# Patient Record
Sex: Female | Born: 1990 | Race: White | Hispanic: No | Marital: Single
Health system: Midwestern US, Academic
[De-identification: ages and names within clinical notes are randomized; demographics above are authoritative.]

---

## 2016-11-27 MED ORDER — SYRINGE-NEEDLE,SAFETY,DISP UNT 3 ML 22 GAUGE X 1 1/2" MISC SYRG
1 | 0 refills | 28.00000 days | Status: DC
Start: 2016-11-27 — End: 2017-07-12

## 2016-11-27 MED ORDER — CYANOCOBALAMIN (VITAMIN B-12) 1,000 MCG/ML IJ SOLN
1 mL | INTRAMUSCULAR | 0 refills | 29.00000 days | Status: DC
Start: 2016-11-27 — End: 2017-07-12

## 2016-12-28 ENCOUNTER — Encounter: Admit: 2016-12-28 | Discharge: 2016-12-28 | Payer: BC Managed Care – PPO

## 2017-02-08 ENCOUNTER — Encounter: Admit: 2017-02-08 | Discharge: 2017-02-08 | Payer: BC Managed Care – PPO

## 2017-05-05 ENCOUNTER — Encounter: Admit: 2017-05-05 | Discharge: 2017-05-05 | Payer: BC Managed Care – PPO

## 2017-05-05 NOTE — Telephone Encounter
Patient phoned to say she had to cancel appointment for Monday  Had labs done recently with PCP and thyroid is abnormal  Is having lab results faxed to [place]  Cant get in till December

## 2017-05-06 NOTE — Telephone Encounter
Closing encounter until labs get here - not ordered by [place]

## 2017-05-06 NOTE — Telephone Encounter
Labs received TSH 0.26, FT4 1.79  Forwarding labs to Dr David Stall for review

## 2017-05-07 NOTE — Telephone Encounter
Called Carla Jacobs relayed message Carla Jacobs V/U  Carla Jacobs states she is supposed to be taking Tirosint 125 mcg 6 days per week  Was feeling bad so bumped ut to 7 days per week  Will stay on 125 mcg and go back to 6 days per week

## 2017-05-07 NOTE — Telephone Encounter
Her thyroid dose is borderline high  We can continue the same dose - it is very close to normal - or decrease her Tirosint from 125 to 112

## 2017-05-17 ENCOUNTER — Encounter: Admit: 2017-05-17 | Discharge: 2017-05-17 | Payer: BC Managed Care – PPO

## 2017-05-18 ENCOUNTER — Encounter: Admit: 2017-05-18 | Discharge: 2017-05-18 | Payer: BC Managed Care – PPO

## 2017-05-18 MED ORDER — TIROSINT 125 MCG PO CAP
ORAL_CAPSULE | Freq: Every day | ORAL | 0 refills | 30.00000 days | Status: AC
Start: 2017-05-18 — End: 2018-11-02

## 2017-05-18 MED ORDER — LEVOTHYROXINE 112 MCG PO TAB
ORAL_TABLET | Freq: Every day | 3 refills
Start: 2017-05-18 — End: ?

## 2017-05-18 MED ORDER — LEVOTHYROXINE 125 MCG PO CAP
125 ug | ORAL_CAPSULE | Freq: Every day | ORAL | 0 refills | 30.00000 days | Status: AC
Start: 2017-05-18 — End: 2017-07-12

## 2017-07-12 ENCOUNTER — Ambulatory Visit: Admit: 2017-07-12 | Discharge: 2017-07-13 | Payer: BC Managed Care – PPO

## 2017-07-12 ENCOUNTER — Encounter: Admit: 2017-07-12 | Discharge: 2017-07-12 | Payer: BC Managed Care – PPO

## 2017-07-12 DIAGNOSIS — E538 Deficiency of other specified B group vitamins: ICD-10-CM

## 2017-07-12 DIAGNOSIS — E890 Postprocedural hypothyroidism: Principal | ICD-10-CM

## 2017-07-12 DIAGNOSIS — E039 Hypothyroidism, unspecified: Principal | ICD-10-CM

## 2017-07-12 DIAGNOSIS — M436 Torticollis: ICD-10-CM

## 2017-07-12 DIAGNOSIS — H04123 Dry eye syndrome of bilateral lacrimal glands: ICD-10-CM

## 2017-07-12 NOTE — Progress Notes
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

## 2017-07-12 NOTE — Progress Notes
Date of Service: 07/12/2017    Subjective:             Carla Jacobs is a 26 y.o. female here for follow up of hypothyroidism    History of Present Illness    Eyes have been dry and hurting, burning for a few weeks. Been using moisture drops.   No double vision  Dry nose, stuffed sinuses [place]   Some diarrhea for 2 weeks.   On Tirosint, daily daily on an empty stomach. Has skipped a couple of doses in the last week  Studying for Step 1       Review of Systems   Constitutional: Positive for unexpected weight change.   Eyes: Positive for pain and itching.   Gastrointestinal:        Reflux   Genitourinary: Positive for menstrual problem.         Objective:         ??? carisoprodol(+) (SOMA) 350 mg tablet Take 250 mg by mouth as Needed for Muscle Cramps.   ??? dextroamphetamine-amphetamine (ADDERALL) 10 mg tablet Take 10 mg by mouth twice daily   ??? ETONOGESTREL (IMPLANON SDRM) by Subdermal route.   ??? TIROSINT 125 mcg capsule TAKE ONE CAPSULE BY MOUTH EVERY DAY     Vitals:    07/12/17 1416   BP: 130/79   Pulse: 76   Weight: 81.7 kg (180 lb 3.2 oz)   Height: 170.2 cm (67.01)     Body mass index is 28.22 kg/m???.     Physical Exam   Constitutional: She is oriented to person, place, and time. She appears well-developed and well-nourished.   HENT:   Head: Normocephalic and atraumatic.   Eyes:   Mild lid lag, no stare. No exophthalmos, no injection, mild periorbital swelling   Neck: No thyromegaly present.   Pulmonary/Chest: Effort normal.   Neurological: She is alert and oriented to person, place, and time.   No tremor   Skin:   Cold clammy skin   Psychiatric: Her behavior is normal. Thought content normal.   Vitals reviewed.           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  Vit B12 deficiency  - Brief replacement of IM B12, after OTC supplements did not correct her deficiency  - Has not refilled Rx  - Repeat B12 levels  ???  RTC 12 months

## 2017-07-26 ENCOUNTER — Ambulatory Visit: Admit: 2017-07-26 | Discharge: 2017-07-26 | Payer: BC Managed Care – PPO

## 2017-07-26 DIAGNOSIS — H04123 Dry eye syndrome of bilateral lacrimal glands: ICD-10-CM

## 2017-07-26 DIAGNOSIS — E538 Deficiency of other specified B group vitamins: Principal | ICD-10-CM

## 2017-07-26 DIAGNOSIS — E890 Postprocedural hypothyroidism: ICD-10-CM

## 2017-07-26 LAB — THYROGLOBULIN & THYROGLOBULIN AB: Lab: 0.6 ng/mL — ABNORMAL LOW (ref 1.7–55.0)

## 2017-07-26 LAB — THYROID STIMULATING HORMONE-TSH: Lab: 2.1 uU/mL (ref 0.35–5.00)

## 2017-07-26 LAB — FREE T4 (FREE THYROXINE) ONLY: Lab: 1.3 ng/dL (ref 0.6–1.6)

## 2017-07-26 LAB — VITAMIN B12: Lab: 327 pg/mL (ref 180–914)

## 2017-07-28 LAB — THYROTROPIN (TSH) RECEPTOR AB

## 2017-07-29 LAB — THYROID STIM IMMUNOGLOBULIN(TSI)

## 2017-08-10 ENCOUNTER — Encounter: Admit: 2017-08-10 | Discharge: 2017-08-10 | Payer: BC Managed Care – PPO

## 2017-08-11 ENCOUNTER — Encounter: Admit: 2017-08-11 | Discharge: 2017-08-11 | Payer: BC Managed Care – PPO

## 2017-08-11 MED ORDER — TIROSINT 125 MCG PO CAP
ORAL_CAPSULE | Freq: Every day | ORAL | 3 refills | 30.00000 days | Status: AC
Start: 2017-08-11 — End: 2018-11-02

## 2018-06-03 ENCOUNTER — Encounter: Admit: 2018-06-03 | Discharge: 2018-06-03 | Payer: BC Managed Care – PPO

## 2018-06-03 MED ORDER — CYANOCOBALAMIN (VITAMIN B-12) 1,000 MCG/ML IJ SOLN
1 mL | INTRAMUSCULAR | 0 refills
Start: 2018-06-03 — End: ?

## 2018-09-03 ENCOUNTER — Encounter: Admit: 2018-09-03 | Discharge: 2018-09-03 | Payer: BC Managed Care – PPO

## 2018-09-05 ENCOUNTER — Ambulatory Visit: Admit: 2018-09-05 | Discharge: 2018-09-06 | Payer: BC Managed Care – PPO

## 2018-09-05 ENCOUNTER — Encounter: Admit: 2018-09-05 | Discharge: 2018-09-05 | Payer: BC Managed Care – PPO

## 2018-09-05 DIAGNOSIS — N898 Other specified noninflammatory disorders of vagina: ICD-10-CM

## 2018-09-05 DIAGNOSIS — R05 Cough: Secondary | ICD-10-CM

## 2018-09-05 DIAGNOSIS — E039 Hypothyroidism, unspecified: Principal | ICD-10-CM

## 2018-09-05 DIAGNOSIS — M436 Torticollis: ICD-10-CM

## 2018-09-05 LAB — DIRECT EXAM (WET PREP)

## 2018-09-05 MED ORDER — TIROSINT 125 MCG PO CAP
ORAL_CAPSULE | Freq: Every day | 2 refills
Start: 2018-09-05 — End: ?

## 2018-09-05 MED ORDER — OSELTAMIVIR 75 MG PO CAP
75 mg | ORAL_CAPSULE | Freq: Two times a day (BID) | ORAL | 0 refills | Status: AC
Start: 2018-09-05 — End: 2018-09-05

## 2018-09-05 MED ORDER — TIROSINT 125 MCG PO CAP
ORAL_CAPSULE | Freq: Every day | 0 refills
Start: 2018-09-05 — End: ?

## 2018-09-05 MED ORDER — VALACYCLOVIR 1 GRAM PO TAB
1000 mg | ORAL_TABLET | Freq: Two times a day (BID) | ORAL | 0 refills | Status: AC
Start: 2018-09-05 — End: ?

## 2018-09-05 NOTE — Telephone Encounter
Patient called and requested for Tamiflu to be canceled and requested Valtrex to be sent for vaginal lesions.

## 2018-09-06 DIAGNOSIS — Z9189 Other specified personal risk factors, not elsewhere classified: ICD-10-CM

## 2018-09-06 DIAGNOSIS — J101 Influenza due to other identified influenza virus with other respiratory manifestations: Principal | ICD-10-CM

## 2018-09-06 DIAGNOSIS — N898 Other specified noninflammatory disorders of vagina: ICD-10-CM

## 2018-09-06 LAB — CULTURE-URINE W/SENSITIVITY

## 2018-09-06 LAB — CHLAM/NG PCR SWAB: Lab: NEGATIVE

## 2018-09-06 LAB — TRICHOMONAS VAGINALIS SWAB: Lab: NEGATIVE

## 2018-09-06 LAB — HERPES SIMPLEX PCR - NON-BLOOD

## 2018-09-07 ENCOUNTER — Encounter: Admit: 2018-09-07 | Discharge: 2018-09-07 | Payer: BC Managed Care – PPO

## 2018-09-07 LAB — HERPES SIMPLEX IGG AB (HSV IGG)
Lab: NEGATIVE FL (ref 7–11)
Lab: NEGATIVE K/UL (ref 150–400)

## 2018-09-07 MED ORDER — FLUCONAZOLE 150 MG PO TAB
ORAL_TABLET | ORAL | 0 refills | 3.00000 days | Status: AC
Start: 2018-09-07 — End: 2019-01-12

## 2018-09-22 ENCOUNTER — Encounter: Admit: 2018-09-22 | Discharge: 2018-09-22 | Payer: BC Managed Care – PPO

## 2018-10-30 ENCOUNTER — Encounter: Admit: 2018-10-30 | Discharge: 2018-10-30 | Payer: BC Managed Care – PPO

## 2018-11-02 MED ORDER — TIROSINT 125 MCG PO CAP
125 ug | ORAL_CAPSULE | Freq: Every day | ORAL | 1 refills | 30.00000 days | Status: DC
Start: 2018-11-02 — End: 2019-01-12

## 2018-11-03 ENCOUNTER — Encounter: Admit: 2018-11-03 | Discharge: 2018-11-03 | Payer: BC Managed Care – PPO

## 2018-11-04 ENCOUNTER — Encounter: Admit: 2018-11-04 | Discharge: 2018-11-04 | Payer: BC Managed Care – PPO

## 2018-11-04 NOTE — Telephone Encounter
Faxed completed form to [facility]   Fax confirmation received 4/10 at 1020

## 2019-01-11 ENCOUNTER — Encounter: Admit: 2019-01-11 | Discharge: 2019-01-11

## 2019-01-11 NOTE — Telephone Encounter
Spoke to patient - confirmed tomorrow's visit is TELEHEALTH

## 2019-01-12 ENCOUNTER — Encounter: Admit: 2019-01-12 | Discharge: 2019-01-12

## 2019-01-12 DIAGNOSIS — K2970 Gastritis, unspecified, without bleeding: Secondary | ICD-10-CM

## 2019-01-12 DIAGNOSIS — E039 Hypothyroidism, unspecified: Secondary | ICD-10-CM

## 2019-01-12 DIAGNOSIS — M436 Torticollis: Secondary | ICD-10-CM

## 2019-01-12 NOTE — Progress Notes
Obtained patient's verbal consent to treat them and their agreement to [place] and NPP via this telehealth visit during the [facility] Emergency  Vitals obtained by patient at home           [facility] - drew thyroid labs and changed dosing in last few months . Thinks it was march   Called that office  They are faxing thyroid labs they have   No B12 lab results in their system  She did go ahead and give verbal that TSH 0.042 on 3/9

## 2019-01-12 NOTE — Progress Notes
Date of Service: 01/12/2019    Subjective:             Carla Jacobs is a 28 y.o. 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   Dose has been fluctuating, with doses between 100 and .     Current treatment: BRAND NAME Tirosint 112 mcg daily   ???  Fa had Graves disease, treated with RAI.   Mother has been hyperthyroid for a few years, TSH<0.01 but has not proceeded with work up as she feels fine.                Review of Systems   Constitutional: Positive for fatigue and unexpected weight change.   Eyes: Negative for visual disturbance.   Respiratory: Negative for shortness of breath.    Cardiovascular: Negative for chest pain and palpitations.   Gastrointestinal: Positive for nausea. Negative for constipation and diarrhea.        Reflux   Endocrine: Negative for cold intolerance and heat intolerance.   Genitourinary: Positive for menstrual problem.   Neurological: Negative for tremors and weakness. Psychiatric/Behavioral: Negative for decreased concentration, dysphoric mood and sleep disturbance.         Objective:         ??? carisoprodol(+) (SOMA) 350 mg tablet Take 250 mg by mouth as Needed for Muscle Cramps.   ??? dextroamphetamine-amphetamine (ADDERALL) 10 mg tablet Take 10 mg by mouth twice daily   ??? levonorgestrel (SKYLA IU) by Intrauterine route.   ??? levothyroxine (TIROSINT) 112 mcg capsule Take 112 mcg by mouth daily. TIROSINT     Vitals:    01/12/19 1353   Weight: 77.1 kg (170 lb)   Height: 168.9 cm (66.5)   PainSc: Zero     Body mass index is 27.03 kg/m???.     Physical Exam  Vitals signs reviewed.   Constitutional:       Appearance: She is well-developed.   HENT:      Head: Normocephalic and atraumatic.   Neck:      Thyroid: No thyromegaly.   Pulmonary:      Effort: Pulmonary effort is normal.   Neurological:      Mental Status: She is alert and oriented to person, place, and time.      Comments: No tremor   Psychiatric:         Behavior: Behavior normal.  Thought Content: Thought content normal.              Assessment and Plan:                 Postablative hypothyroidism  - On Tirosint replacement, previously well replaced  - Appears clinically euthyroid  - DDx includes biochemical hypo-, hyper- or euthyroidism  - Check TFTs  8 wks after last dose change   - Adjust Tirosint???dose for goal TSH in the normal range    ???  Vit B12 deficiency  - Brief replacement of IM B12, after OTC supplements did not correct her deficiency  - Has not refilled Rx  - Repeat B12 levels  ???  RTC 12 months

## 2019-01-13 ENCOUNTER — Ambulatory Visit: Admit: 2019-01-12 | Discharge: 2019-01-13

## 2019-01-13 DIAGNOSIS — E538 Deficiency of other specified B group vitamins: Secondary | ICD-10-CM

## 2019-01-13 DIAGNOSIS — E890 Postprocedural hypothyroidism: Principal | ICD-10-CM

## 2019-01-13 LAB — VITAMIN B12: Lab: 5.4 — AB (ref 200–1100)

## 2019-01-13 LAB — TSH WITH FREE T4 REFLEX: Lab: 0.4 — AB (ref 0.465–4.68)

## 2019-01-19 ENCOUNTER — Encounter: Admit: 2019-01-19 | Discharge: 2019-01-19

## 2019-01-20 ENCOUNTER — Encounter: Admit: 2019-01-20 | Discharge: 2019-01-20

## 2019-01-20 DIAGNOSIS — E538 Deficiency of other specified B group vitamins: Secondary | ICD-10-CM

## 2019-01-20 DIAGNOSIS — E890 Postprocedural hypothyroidism: Secondary | ICD-10-CM

## 2019-01-20 MED ORDER — LEVOTHYROXINE 100 MCG PO CAP
100 ug | ORAL_CAPSULE | Freq: Every day | ORAL | 3 refills | 30.00000 days | Status: DC
Start: 2019-01-20 — End: 2019-01-23

## 2019-01-20 NOTE — Telephone Encounter
See Labs entered from 01/13/2019

## 2019-01-20 NOTE — Telephone Encounter
Relayed message to patient . Patient asking if Carla Jacobs can see a reason as to why she would be having to adjust the dose again. She always has just bounced back between the 112-125 mcg dosing and never had to go beyond that. Patient also wants lab order routed to her through mychart  .Routing to  [facility]

## 2019-01-20 NOTE — Telephone Encounter
-----   Message from Annamary Rummage, [place] sent at 01/20/2019 12:55 PM CDT -----  B 12 is actually 504.  Let her know she is still a bit overreplaced so she can reduce to 100 mcg daily and then repeat TSH/free T4 in 8 weeks.  [place]

## 2019-01-21 ENCOUNTER — Encounter: Admit: 2019-01-21 | Discharge: 2019-01-21

## 2019-01-23 ENCOUNTER — Encounter: Admit: 2019-01-23 | Discharge: 2019-01-23

## 2019-01-23 MED ORDER — TIROSINT 100 MCG PO CAP
100 ug | ORAL_CAPSULE | Freq: Every day | ORAL | 3 refills | 30.00000 days | Status: DC
Start: 2019-01-23 — End: 2019-02-02

## 2019-02-02 ENCOUNTER — Encounter: Admit: 2019-02-02 | Discharge: 2019-02-02

## 2019-02-02 DIAGNOSIS — E039 Hypothyroidism, unspecified: Secondary | ICD-10-CM

## 2019-02-02 MED ORDER — TIROSINT 112 MCG PO CAP
112 ug | ORAL_CAPSULE | Freq: Every day | ORAL | 5 refills | 30.00000 days | Status: DC
Start: 2019-02-02 — End: 2019-05-24

## 2019-02-16 NOTE — Telephone Encounter
See mychart messages

## 2019-05-23 ENCOUNTER — Encounter: Admit: 2019-05-23 | Discharge: 2019-05-23 | Payer: BC Managed Care – PPO

## 2019-05-24 ENCOUNTER — Encounter: Admit: 2019-05-24 | Discharge: 2019-05-24 | Payer: BC Managed Care – PPO

## 2019-05-24 DIAGNOSIS — E039 Hypothyroidism, unspecified: Secondary | ICD-10-CM

## 2019-05-24 MED ORDER — TIROSINT 112 MCG PO CAP
112 ug | ORAL_CAPSULE | Freq: Every day | ORAL | 5 refills | 30.00000 days | Status: DC
Start: 2019-05-24 — End: 2020-02-26

## 2019-05-26 ENCOUNTER — Encounter: Admit: 2019-05-26 | Discharge: 2019-05-26 | Payer: BC Managed Care – PPO

## 2019-08-17 ENCOUNTER — Encounter: Admit: 2019-08-17 | Discharge: 2019-08-17 | Payer: BC Managed Care – PPO

## 2019-10-10 ENCOUNTER — Encounter: Admit: 2019-10-10 | Discharge: 2019-10-10 | Payer: BC Managed Care – PPO

## 2019-10-10 DIAGNOSIS — N72 Inflammatory disease of cervix uteri: Secondary | ICD-10-CM

## 2019-10-10 DIAGNOSIS — N760 Acute vaginitis: Secondary | ICD-10-CM

## 2019-10-10 DIAGNOSIS — K2970 Gastritis, unspecified, without bleeding: Secondary | ICD-10-CM

## 2019-10-10 DIAGNOSIS — M436 Torticollis: Secondary | ICD-10-CM

## 2019-10-10 DIAGNOSIS — E039 Hypothyroidism, unspecified: Secondary | ICD-10-CM

## 2019-10-10 MED ORDER — METRONIDAZOLE 500 MG PO TAB
500 mg | ORAL_TABLET | Freq: Two times a day (BID) | ORAL | 0 refills | Status: AC
Start: 2019-10-10 — End: ?

## 2019-10-10 MED ORDER — AZITHROMYCIN 1 GRAM PO PACK
1 | Freq: Once | ORAL | 0 refills | Status: AC
Start: 2019-10-10 — End: ?

## 2019-10-10 MED ORDER — FLUCONAZOLE 150 MG PO TAB
150 mg | ORAL_TABLET | Freq: Once | ORAL | 0 refills | 3.00000 days | Status: AC
Start: 2019-10-10 — End: ?

## 2019-10-10 MED ORDER — CEFTRIAXONE/LIDOCAINE IM 250MG VIAL MIXTURE
250 mg | Freq: Once | INTRAMUSCULAR | 0 refills | Status: CP
Start: 2019-10-10 — End: ?

## 2019-10-11 ENCOUNTER — Ambulatory Visit: Admit: 2019-10-11 | Discharge: 2019-10-10 | Payer: BC Managed Care – PPO

## 2019-11-10 ENCOUNTER — Encounter: Admit: 2019-11-10 | Discharge: 2019-11-10 | Payer: BC Managed Care – PPO

## 2020-01-26 ENCOUNTER — Encounter: Admit: 2020-01-26 | Discharge: 2020-01-26 | Payer: BC Managed Care – PPO

## 2020-01-26 ENCOUNTER — Ambulatory Visit: Admit: 2020-01-26 | Discharge: 2020-01-27 | Payer: BC Managed Care – PPO

## 2020-01-26 DIAGNOSIS — E0500 Thyrotoxicosis with diffuse goiter without thyrotoxic crisis or storm: Secondary | ICD-10-CM

## 2020-01-26 DIAGNOSIS — E538 Deficiency of other specified B group vitamins: Secondary | ICD-10-CM

## 2020-01-26 DIAGNOSIS — K2970 Gastritis, unspecified, without bleeding: Secondary | ICD-10-CM

## 2020-01-26 DIAGNOSIS — M436 Torticollis: Secondary | ICD-10-CM

## 2020-01-26 DIAGNOSIS — E039 Hypothyroidism, unspecified: Secondary | ICD-10-CM

## 2020-01-26 NOTE — Patient Instructions
It was great to see you today.        Our plan:  - get labs drawn for thryoid, CBC, B12, Folate  - Important!! Please hold any supplements containing biotin for 3-5 days prior to lab draw. They can interfere with the test results.  Thyroid labs can be done at any time of day; you do not need to fast.     - go ahead and start OTC B12 replacement; 1000 to 2000 mcg daily     Please make an appointment to come back to clinic in 6 months. I encourage you to do this as soon as possible.     How to get an Appointment:  ? Call [phone], Option 1 for scheduling.  Our office hours are 7am - 4:30pm Monday through Friday.    My nurse is April. You can reach her at [phone]    You can also reach me by sending a secure email message through MyChart email that links directly to your chart.    How to get your test results:  ? We will contact you by MyChart or phone with any test results when they are available.   ? If you are expecting results and have not heard from [place] within 2 weeks of your testing, please send a MyChart message or call the office.    How to get a Medication Refill:  ? Please use the MyChart Refill request or contact your pharmacy directly to request medication refills.  ? Please allow at least 72 hours for refill requests      Your time is important and if you had to wait at all today, I sincerely apologize. My goal is to run exactly on time; however, on occasion, I get behind in clinic due to unexpected patient issues.  Your satisfaction with the care you received today is of utmost importance.  Please contact me directly via MyChart if there are any issues.    Take care,     Glyn Ade, MD

## 2020-01-26 NOTE — Progress Notes
Obtained patient's verbal consent to treat them and their agreement to [facility] financial policy and NPP via this telehealth visit during the [facility] Emergency      Date of Service: 01/26/2020    Subjective:             Carla Jacobs is a 29 y.o. female here for follow up of hypothyroidism  Patient is new to me today.  Chart reviewed / old records reviewed / labs reviewed / imaging reviewed.  I have summarized pertinent information throughout this encounter.       History of Present Illness  Briefly, she has a pmhx of Graves disease status post ablative hypothyroidism,  B12 deficiency, torticollis, and gastritis.       Graves disease s/p RAI ablation  Post ablative hypothyroidism  - dx age 31, after she developed anxiety, palpitations, tremors, weight loss and insomnia  - PMHX remarkable for Hx of Graves in father (treated with RAI) and hx of hypothyroidism in mother.   - initially on high doses of MMI, then PTU, then RAI ablation  - repeat Tg <1 in 2018, demonstrating no significant residual thyroid tissue.   - maintained on LT4, with some alternating doses and brands    Last seen by APP June 2020.    - TSH over suppressed in June 2020    Since her last visit:  She is doing well. 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. [employer].   She remains on Tirosint . She has a little trouble sleeping but no tremors, palpitations, changes in hair/skin/nails. She his have some more frequent stools, but also started drinking more water. Her weight is stable. She has lost a little intentionally, nothing more than 5 lbs. She hasn't been trying to lose weight but has been trying to maintain.   She had her TSH checked in April - by Gyn.  There was a TSH of 15 but FT4 normal 0.9, T3 only slightly low.   During that time, elevated Creat, borderline K+, and she didn't feel well during that time as well.      B12 deficiency:  - had been getting B12 shots - none recently.   - intrinsic Abs checked one time.   - Vit B 12 was very low, 5.4, in June 2020  - she has had some stomach pain, gastritis.  - she has significant glossitis; fissures in her tongue.          Medical History:   Diagnosis Date   ? Gastritis    ? Hypothyroid    ? Torticollis      Surgical History:   Procedure Laterality Date   ? CYST REMOVAL      Omental cyst   ? WISDOM TEETH EXTRACTION       Family History   Problem Relation Age of Onset   ? Thyroid Disease Father         Fa with Graves s/p ablation     Social History     Socioeconomic History   ? Marital status: Single     Spouse name: Not on file   ? Number of children: Not on file   ? Years of education: Not on file   ? Highest education level: Not on file   Occupational History   ? Not on file   Tobacco Use   ? Smoking status: Never Smoker   ?  Smokeless tobacco: Never Used   Substance and Sexual Activity   ? Alcohol use: Yes     Comment: daily    ? Drug use: Not Currently   ? Sexual activity: Not on file   Other Topics Concern   ? Not on file   Social History Narrative   ? Not on file                   Review of Systems   Constitutional: Positive for fatigue.   HENT:        ++Glossitis   Eyes:        Dry eyes   Respiratory: Negative.    Cardiovascular: Negative.    Gastrointestinal:        Gastritis   Endocrine: Negative for cold intolerance and heat intolerance.   Genitourinary:        IUD   Musculoskeletal: Negative.    Skin: Negative.    Neurological: Negative for tremors.   Psychiatric/Behavioral: Positive for decreased concentration and sleep disturbance.        ADD   All other systems reviewed and are negative.        Objective:         ? carisoprodol(+) (SOMA) 350 mg tablet Take 250 mg by mouth as Needed for Muscle Cramps.   ? dextroamphetamine-amphetamine (ADDERALL) 10 mg tablet Take 20 mg by mouth three times daily     ? levonorgestrel (SKYLA IU) by Intrauterine route.   ? TIROSINT 112 mcg capsule Take one capsule by mouth daily. There were no vitals filed for this visit.  There is no height or weight on file to calculate BMI.     Physical Exam  Vitals and nursing note reviewed.   Constitutional:       General: She is not in acute distress.     Appearance: Normal appearance. She is well-developed. She is not ill-appearing.      Comments: Tele visit   HENT:      Head: Normocephalic and atraumatic.      Nose: Nose normal.   Eyes:      Conjunctiva/sclera: Conjunctivae normal.   Neck:      Thyroid: No thyromegaly.   Pulmonary:      Effort: Pulmonary effort is normal. No respiratory distress.   Neurological:      Mental Status: She is alert and oriented to person, place, and time.      Comments: No tremor   Psychiatric:         Mood and Affect: Mood normal.         Behavior: Behavior normal.         Thought Content: Thought content normal.         Judgment: Judgment normal.       Results for Carla Jacobs (MRN [id number]) as of 01/26/2020 08:12   Ref. Range 07/16/2016 11:20 11/23/2016 13:56 07/26/2017 15:41 01/13/2019 00:00   Prolactin Latest Ref Range: 3.3 - 26.7 NG/ML  6.6     T4-Free Latest Ref Range: 0.6 - 1.6 NG/DL 1.3 1.3 1.3    TSH Latest Ref Range: 0.465 - 4.68  0.549 0.220 (L) 2.120 0.406 (A)   Thyroglobulin Ab Latest Ref Range: <4.11 IU/ML  <3.00 <3.00    Thyroglobulin Latest Ref Range: 1.7 - 55.0 NG/ML  0.8 (L) 0.6 (L)    TSI Unknown   <1.0.Marland KitchenMarland Kitchen    TSH Receptor AB Unknown   <1.00...    Metanephrine,Plasma Unknown  0.22  Normetaphrine, Plasma Unknown  0.40            Assessment and Plan:               Graves disease s/p RAI ablation  Postablative hypothyroidism  - On Tirosint replacement, daily  - TSH elevation in April 2021 - due for repeat as she was ill at that time the dose was not changed.  - Appears clinically euthyroid  - she wonders about T3 supplementation  Plan:  - check TSH, FT4, Total T3  - adjust thyroid replacement as indicated  - alerted pt to inform me of any positive pregnancy test as LT4 dosing will need to be increased immediately. Discussed adverse outcomes of baby's health if inadequate thyroid dosing.    ?  Vit B12 deficiency  - Brief replacement of IM B12, after OTC supplements did not correct her deficiency  - now with gastritis, glossitis, some neuro symptoms of decreased concentration  Plan:  - Repeat B12 levels, also get CBC and folate   - go ahead and start OTC B12 replacement; 1000 to 2000 mcg daily   - discussed importance of taking B12   ?  RTC 6,mo    On the day of the tele visit, I spent a total of 40 minutes in this encounter, with greater than 50% spent in counseling and coordination of care.   I reviewed and summarized records from [facility], including notes, labs, imaging, and pathology reports.   I also reviewed any outside records that were received and Care Everywhere records (if applicable).

## 2020-01-27 DIAGNOSIS — E890 Postprocedural hypothyroidism: Secondary | ICD-10-CM

## 2020-02-07 ENCOUNTER — Encounter: Admit: 2020-02-07 | Discharge: 2020-02-07 | Payer: BC Managed Care – PPO

## 2020-02-14 ENCOUNTER — Encounter: Admit: 2020-02-14 | Discharge: 2020-02-14 | Payer: BC Managed Care – PPO

## 2020-02-14 DIAGNOSIS — E890 Postprocedural hypothyroidism: Secondary | ICD-10-CM

## 2020-02-14 DIAGNOSIS — E538 Deficiency of other specified B group vitamins: Secondary | ICD-10-CM

## 2020-02-19 ENCOUNTER — Encounter: Admit: 2020-02-19 | Discharge: 2020-02-19 | Payer: BC Managed Care – PPO

## 2020-02-19 DIAGNOSIS — E538 Deficiency of other specified B group vitamins: Secondary | ICD-10-CM

## 2020-02-19 DIAGNOSIS — E034 Atrophy of thyroid (acquired): Secondary | ICD-10-CM

## 2020-02-26 MED ORDER — TIROSINT 112 MCG PO CAP
ORAL_CAPSULE | ORAL | 3 refills | 30.00000 days | Status: AC
Start: 2020-02-26 — End: ?

## 2020-03-25 ENCOUNTER — Encounter: Admit: 2020-03-25 | Discharge: 2020-03-25 | Payer: BC Managed Care – PPO

## 2020-03-25 ENCOUNTER — Ambulatory Visit: Admit: 2020-03-25 | Discharge: 2020-03-25 | Payer: BC Managed Care – PPO

## 2020-03-25 DIAGNOSIS — E034 Atrophy of thyroid (acquired): Secondary | ICD-10-CM

## 2020-03-25 DIAGNOSIS — E039 Hypothyroidism, unspecified: Secondary | ICD-10-CM

## 2020-03-25 LAB — FREE T4 (FREE THYROXINE) ONLY: Lab: 1.2 ng/dL (ref 0.6–1.6)

## 2020-03-25 LAB — THYROID STIMULATING HORMONE-TSH: Lab: 1.6 uU/mL (ref 0.35–5.00)

## 2020-03-25 LAB — TSH WITH FREE T4 REFLEX: Lab: 1.6 uU/mL (ref 0.35–5.00)

## 2020-08-09 ENCOUNTER — Encounter: Admit: 2020-08-09 | Discharge: 2020-08-09 | Payer: BC Managed Care – PPO

## 2021-03-17 IMAGING — DX Ankle & Foot
3 series · 3 of 3 positions shown · non-contrast
Comparison: None

PROCEDURE: Three-view right ankle

Procedure(s): XR ankle RT min 3V

EXAM: 3 VIEW RIGHT ANKLE
INDICATION: Pain, ankle injury, pain

[AP (1 of 2)]
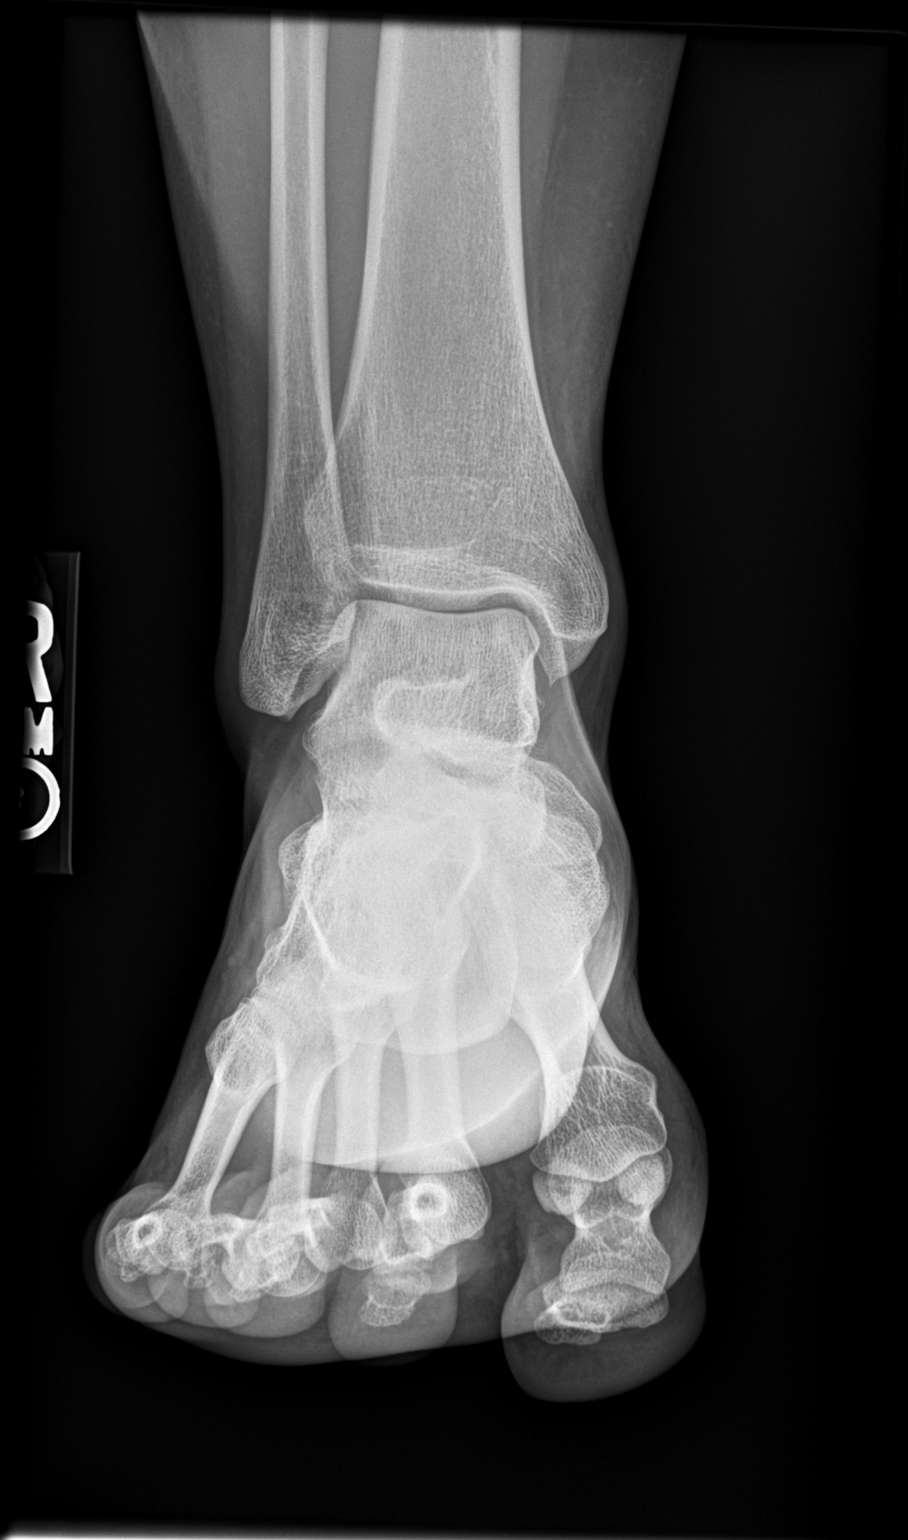

[AP (2 of 2)]
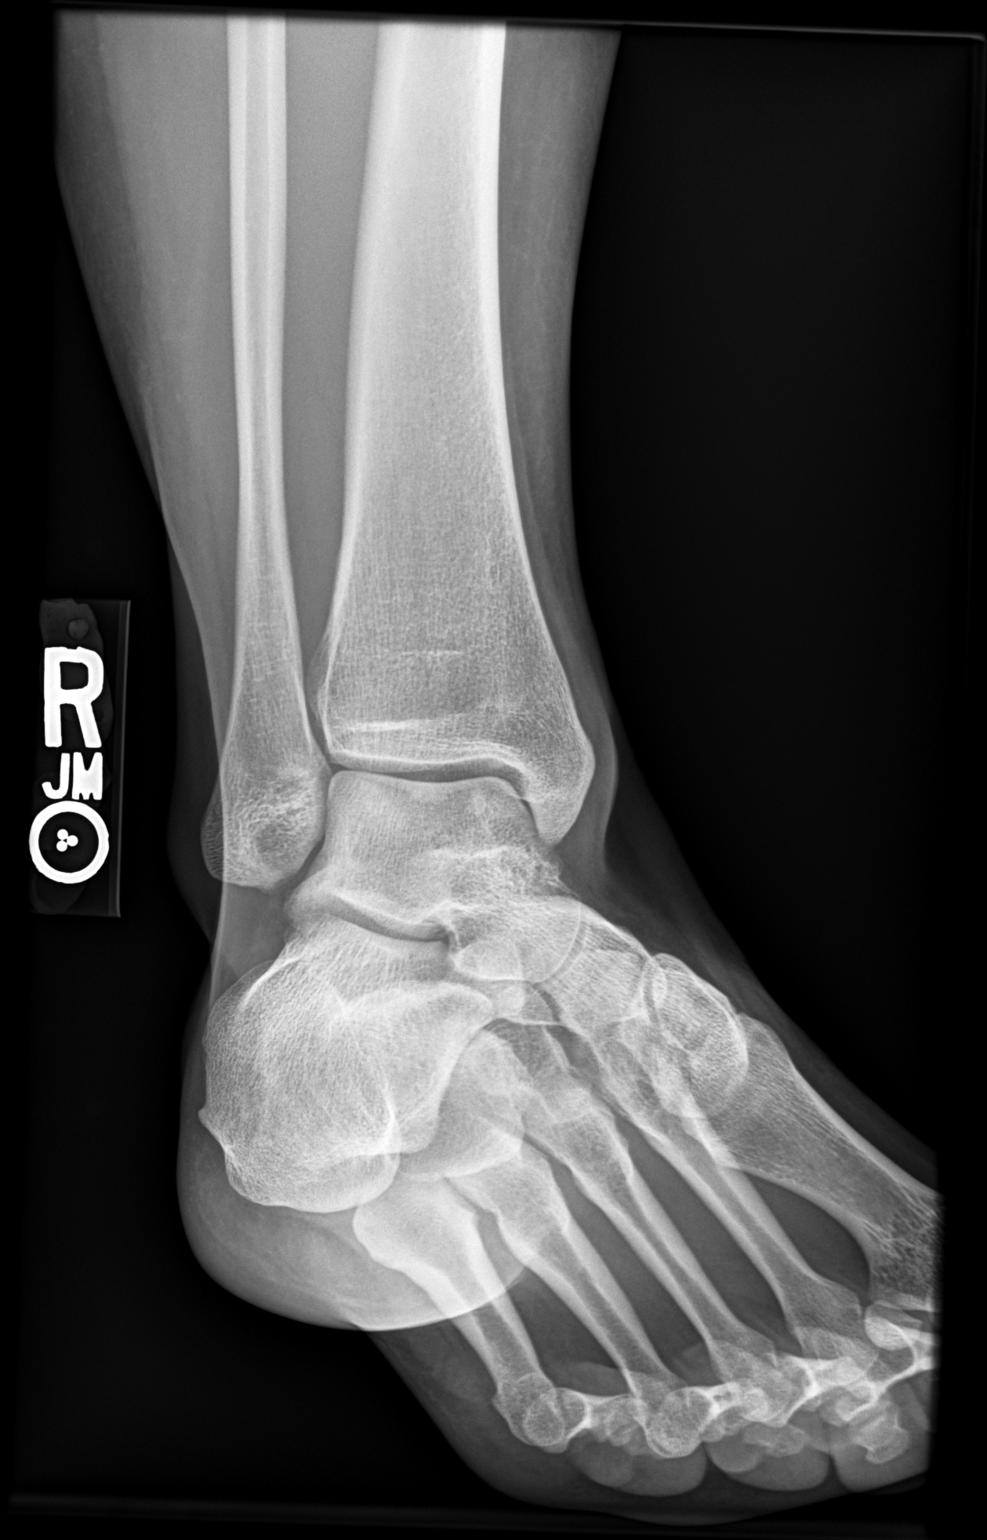

[left lateral]
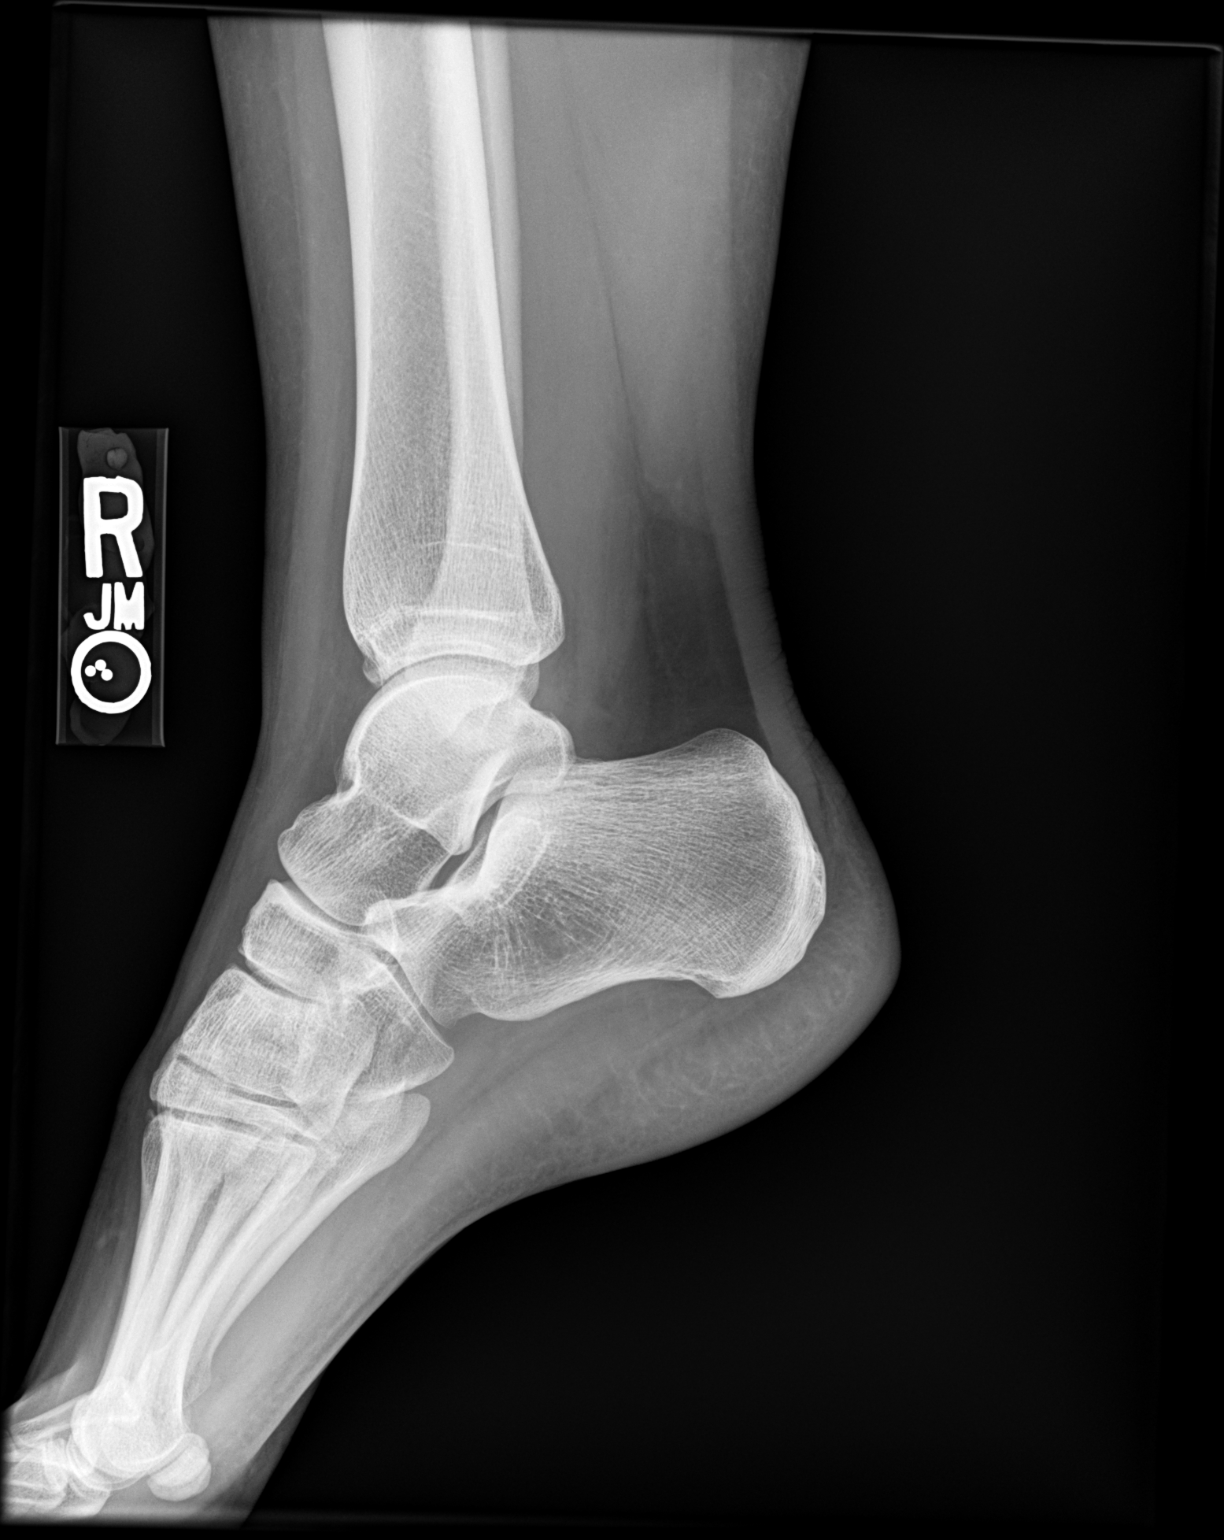

[3 of 3 positions shown; findings below may reference images not displayed]

FINDINGS: No fracture or dislocation. No foreign bodies
IMPRESSION: Normal 3 view right ankle

## 2021-03-17 IMAGING — DX Ankle & Foot
3 series · 3 of 3 positions shown · non-contrast
Comparison: None

PROCEDURE: AP, oblique and lateral views of the right foot

Procedure(s): XR foot RT min 3V

EXAM: 3 VIEW RIGHT FOOT
INDICATION: Pain, foot injury, pain

[AP (1 of 2)]
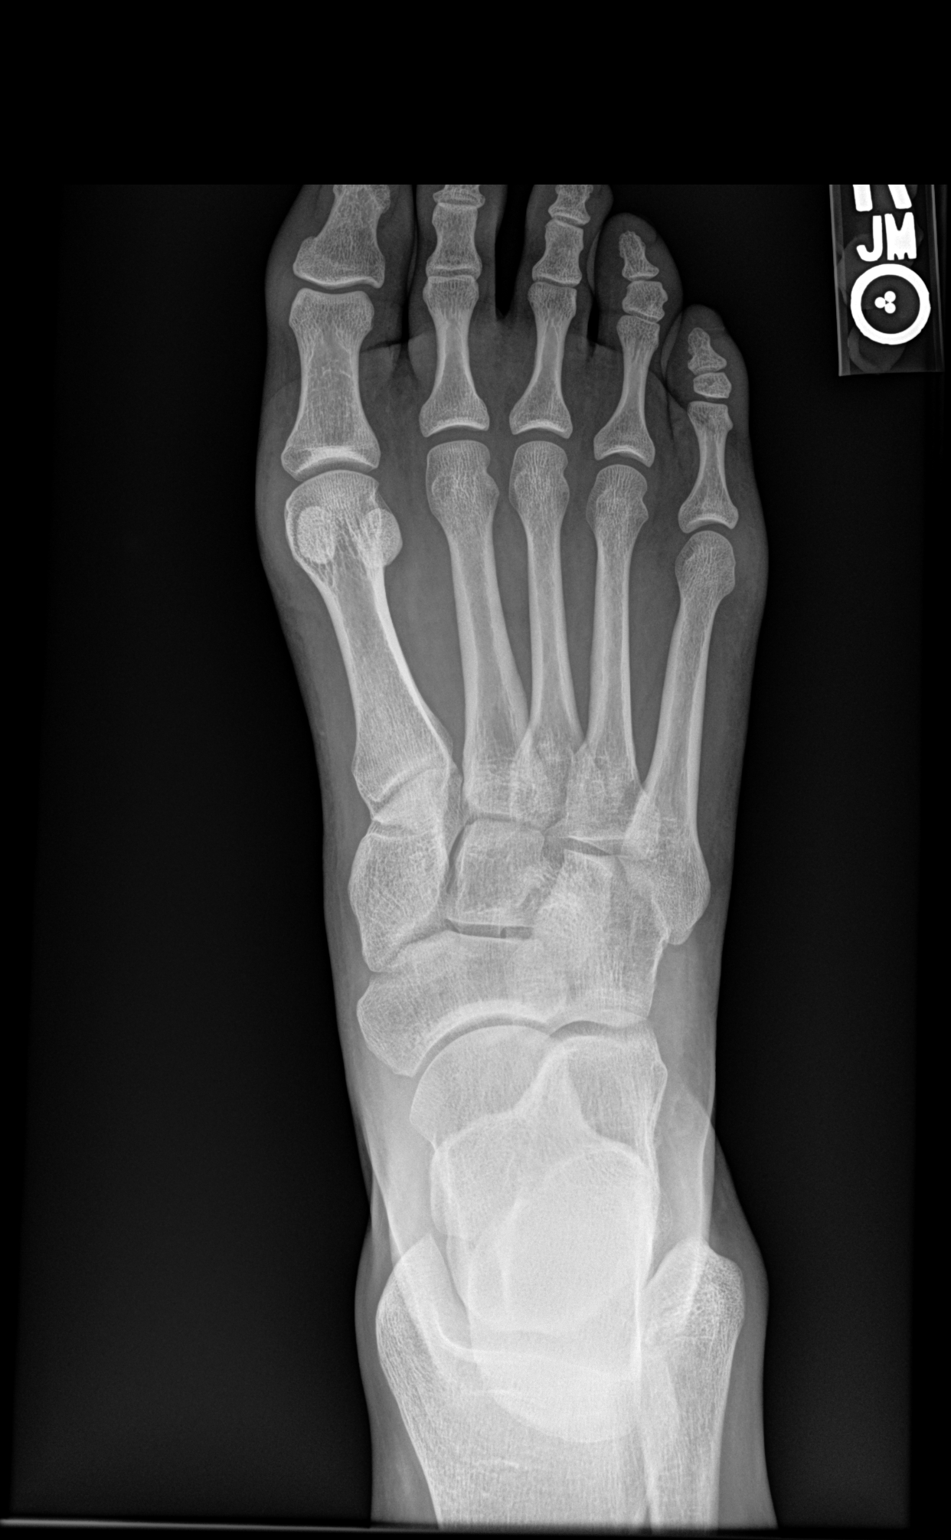

[AP (2 of 2)]
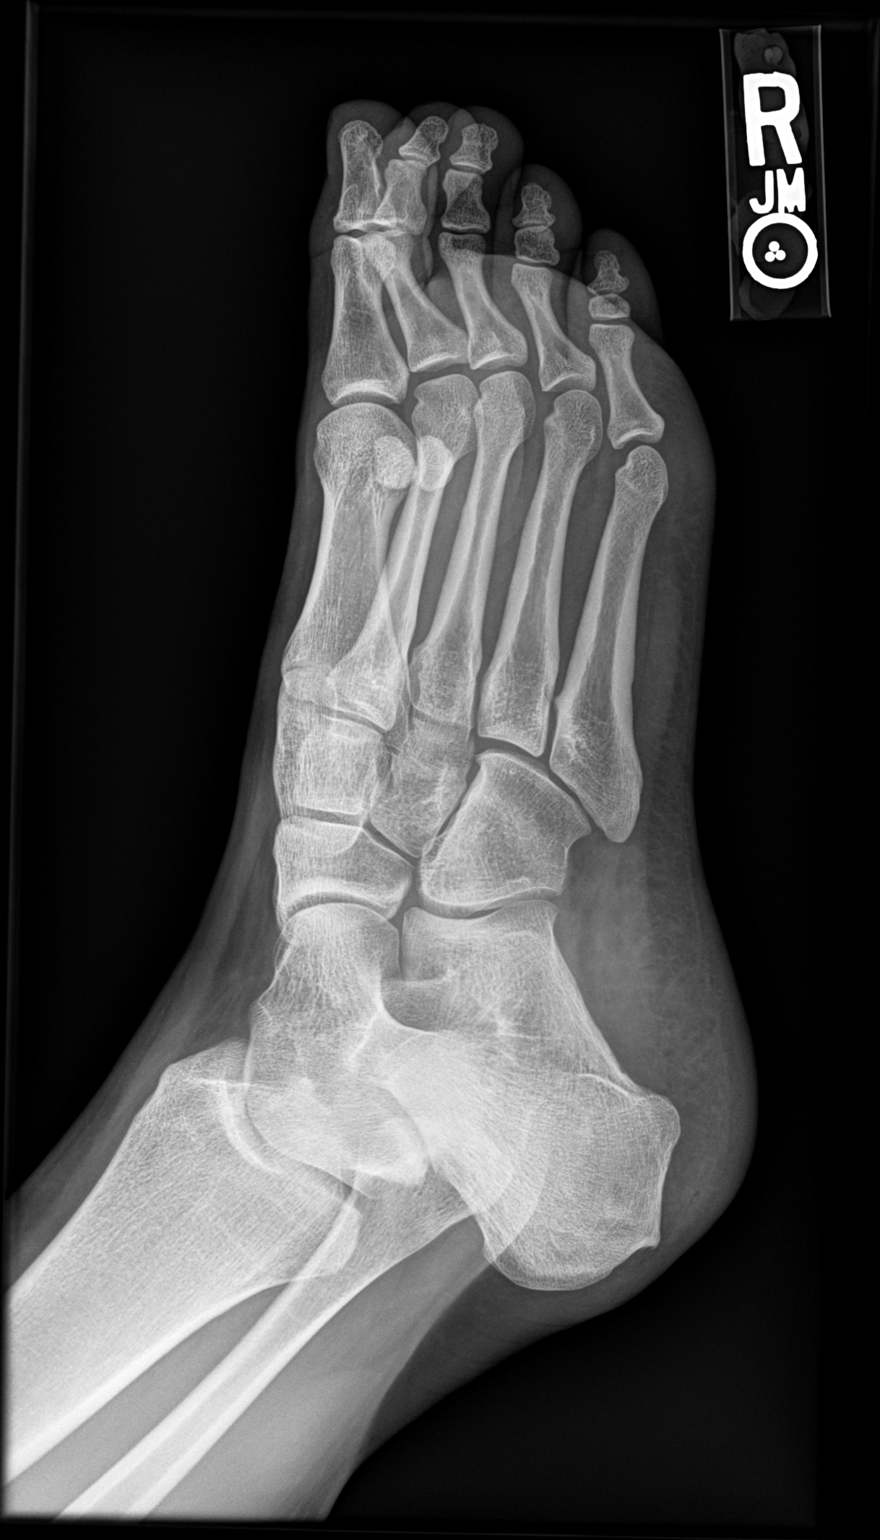

[left lateral]
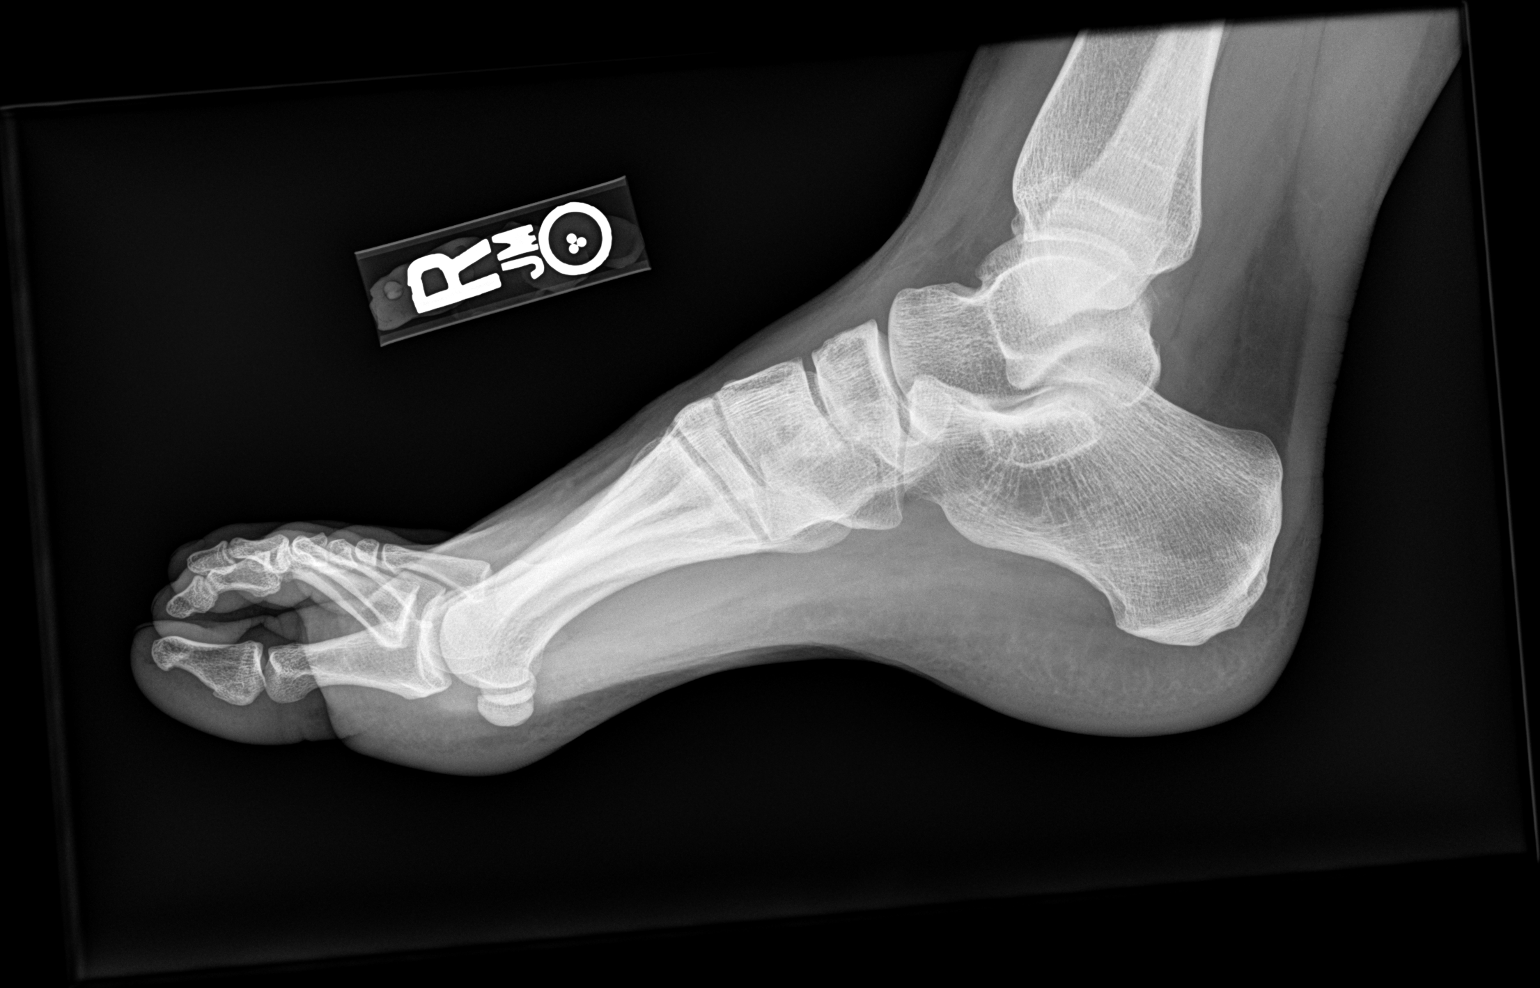

[3 of 3 positions shown; findings below may reference images not displayed]

FINDINGS: No fracture or dislocation. No foreign bodies.
IMPRESSION: Normal 3 view right foot

## 2021-05-09 IMAGING — DX Chest
2 series · 2 of 2 positions shown · non-contrast
Comparison: None

Procedure(s): XR chest 2V

CHEST, 2 VIEWS
INDICATION FOR EXAM:
Cough

[PA]
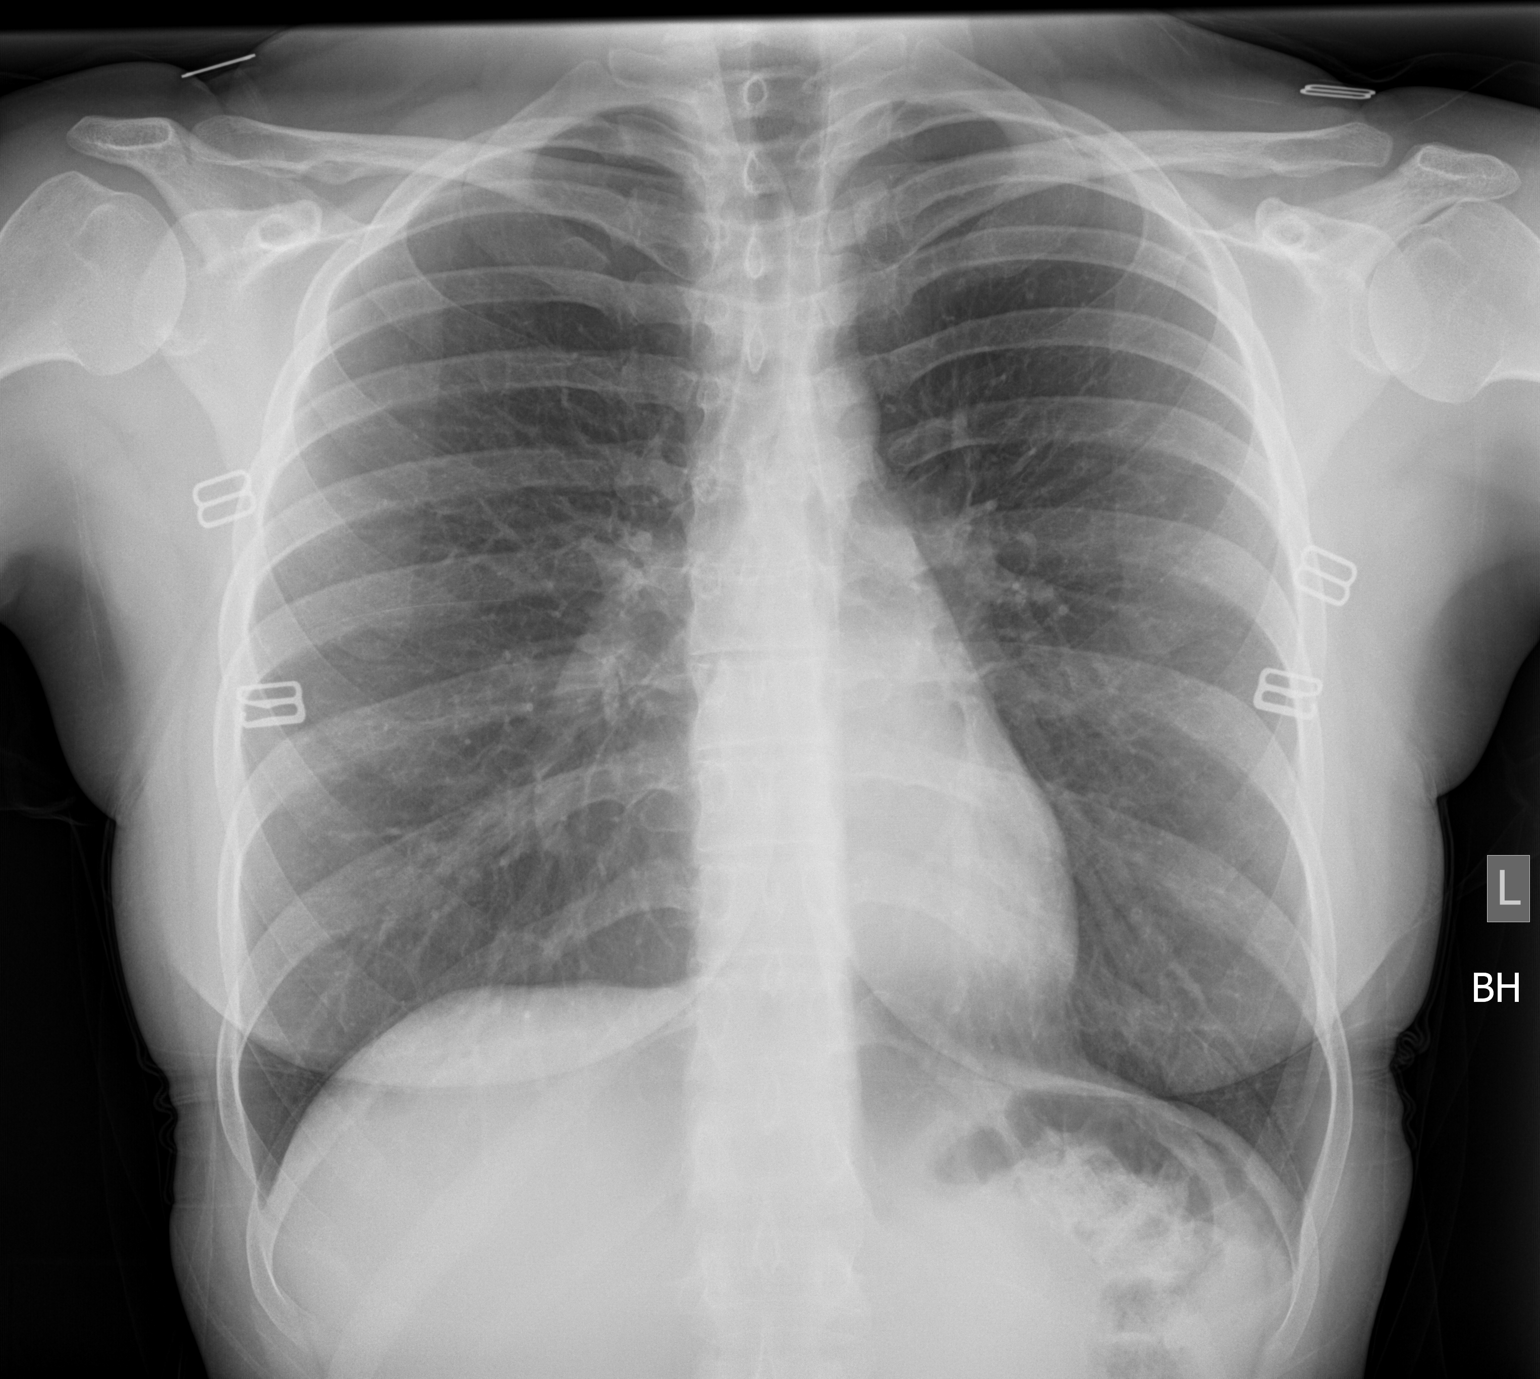

[left lateral]
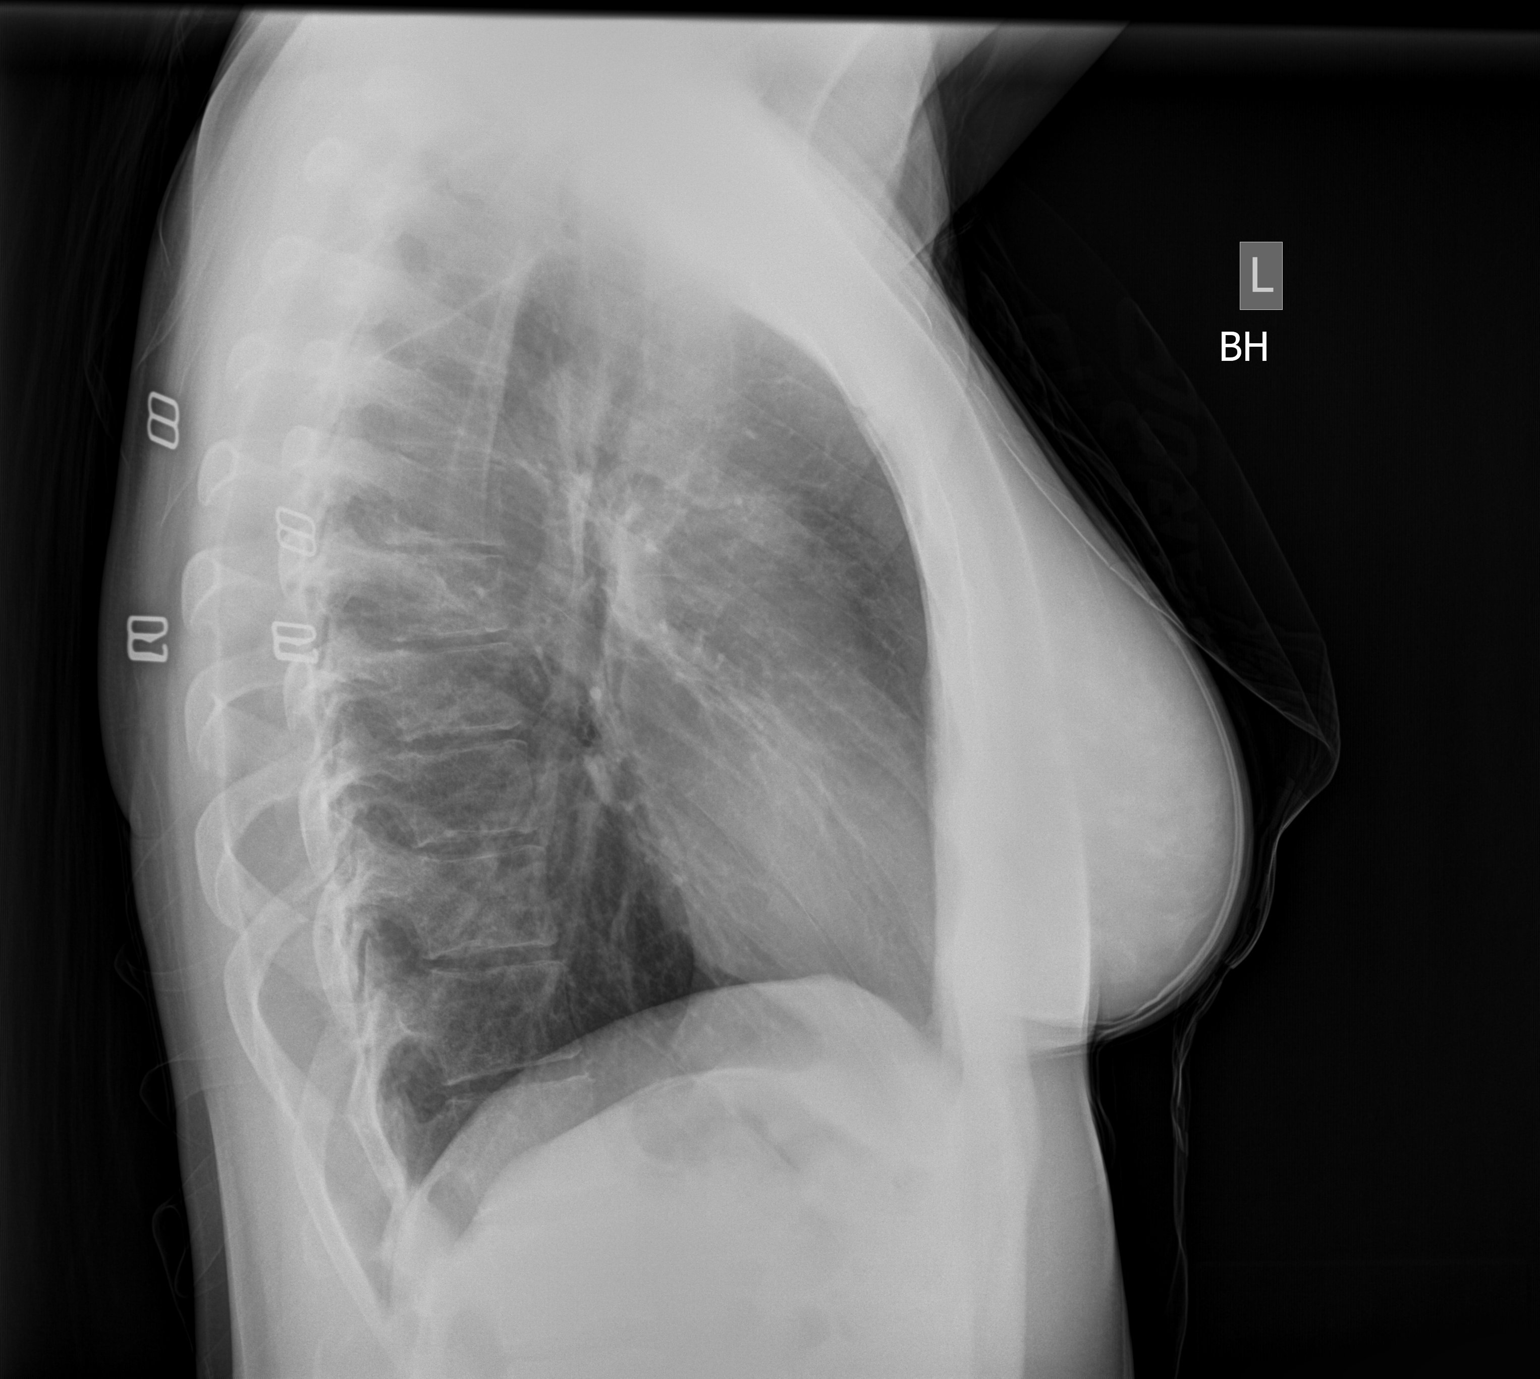

[2 of 2 positions shown; findings below may reference images not displayed]

FINDINGS: The bony thorax is grossly intact. The heart and pulmonary
vascularity are within normal limits.  No focal pulmonary
infiltrate is present.  No pleural effusion is seen.
IMPRESSION: 1.  No acute cardiopulmonary abnormality is identified

## 2021-06-06 IMAGING — MR FOOTRTWO
5 of 7 series · 24 of 40 positions shown · non-contrast
Comparison: none

Procedure(s): MR foot RT wo con

MRI OF THE RIGHT FOOT WITHOUT CONTRAST
HISTORY: Right foot pain

[Series 2: rt 3-(person_name) · axial · 4.0mm · 1.09mm/px · z∈[-36,+139]mm · 6 of 27 slices shown]
[im 1/27]
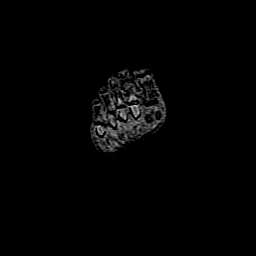
[im 6/27]
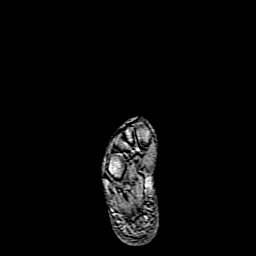
[im 11/27]
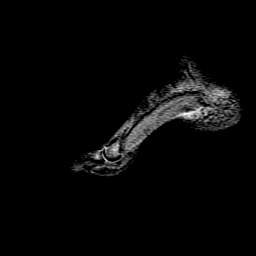
[im 16/27]
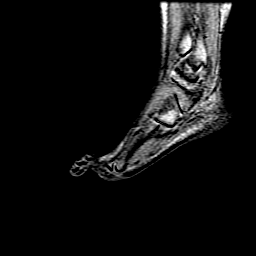
[im 21/27]
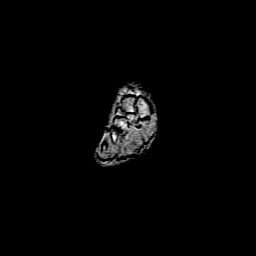
[im 27/27]
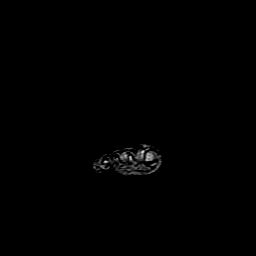

[Series 3: T1 · sagittal · 3.0mm · 0.35mm/px · 5 of 26 slices shown (1 of 3)]
[im 1/26]
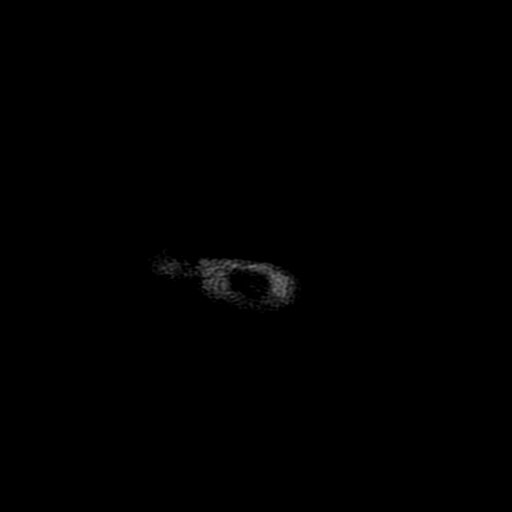
[im 7/26]
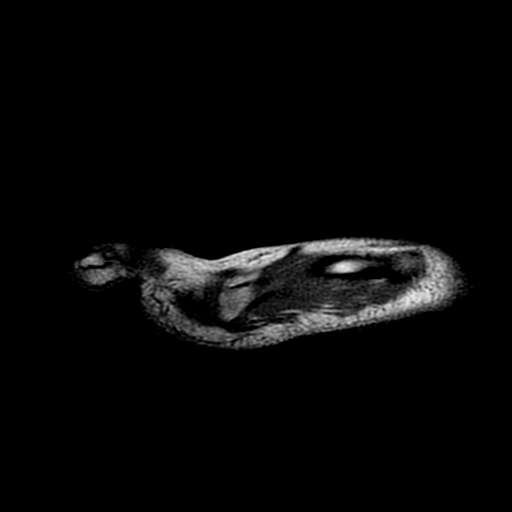
[im 13/26]
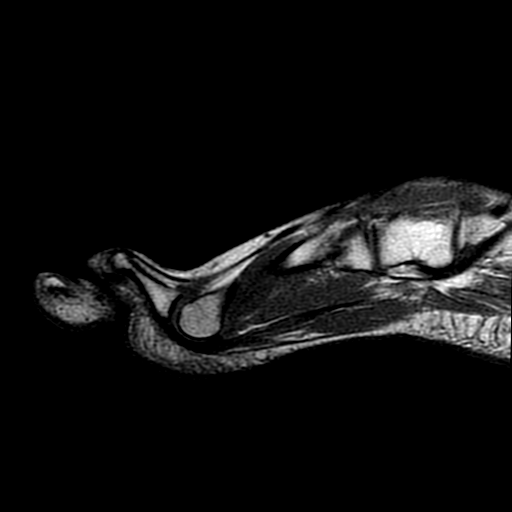
[im 19/26]
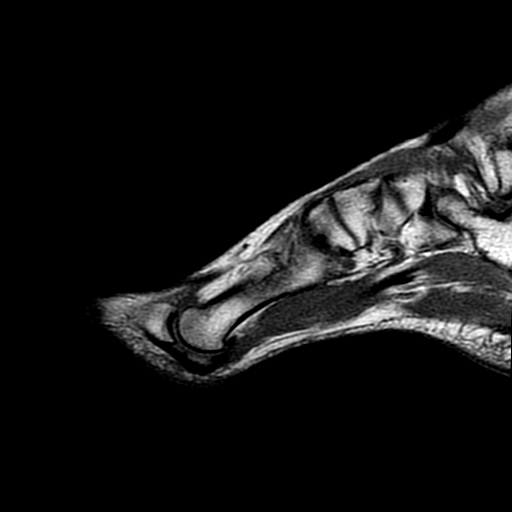
[im 26/26]
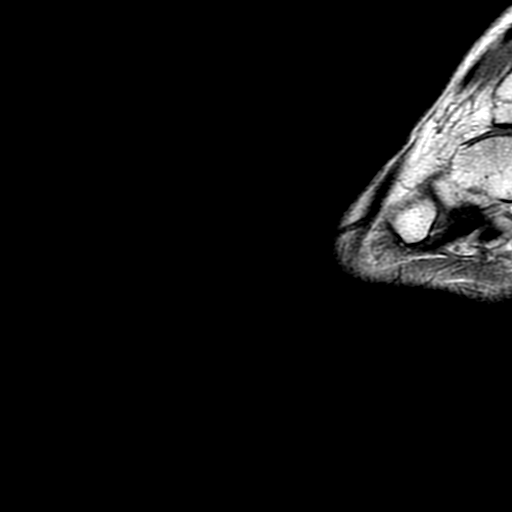

[Series 4: T2 · sagittal · 3.0mm · 0.35mm/px · 1 of 26 slices shown]
[im 1/26]
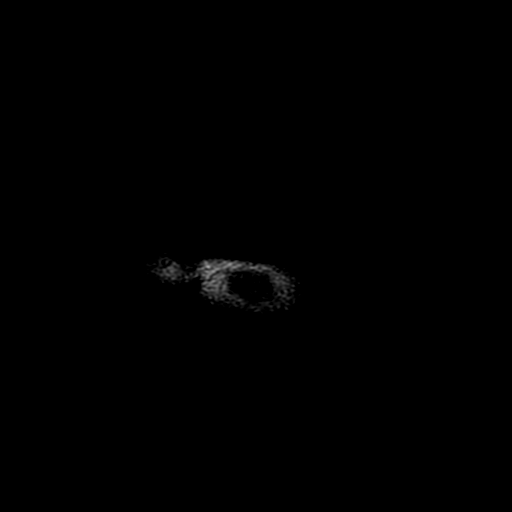

[Series 5: T1 · oblique · 4.0mm · 0.25mm/px · 7 of 32 slices shown (2 of 3)]
[im 1/32]
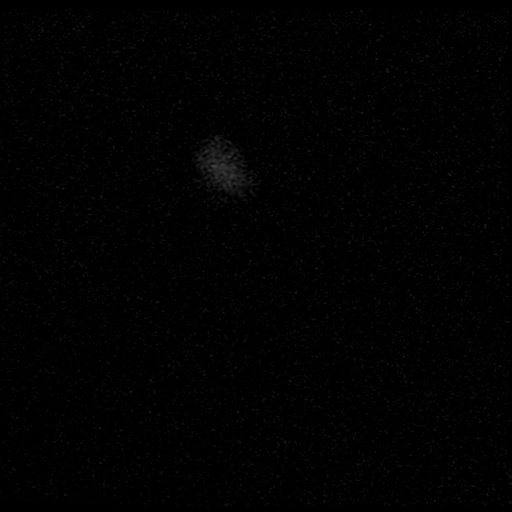
[im 6/32]
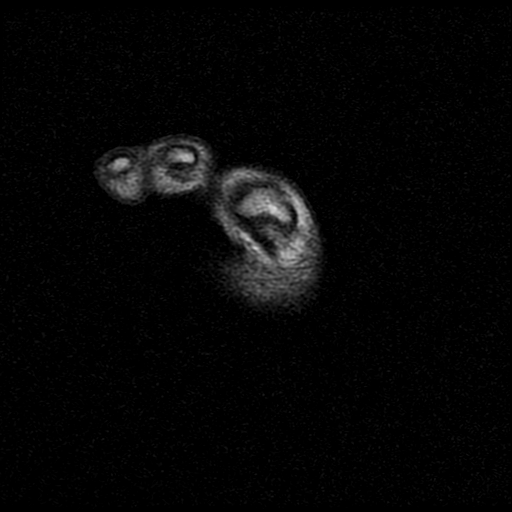
[im 11/32]
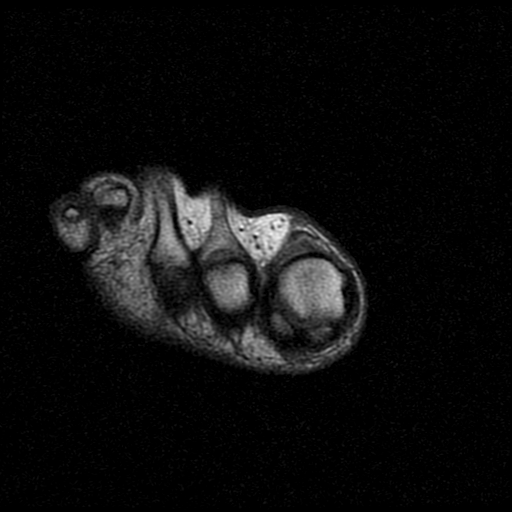
[im 16/32]
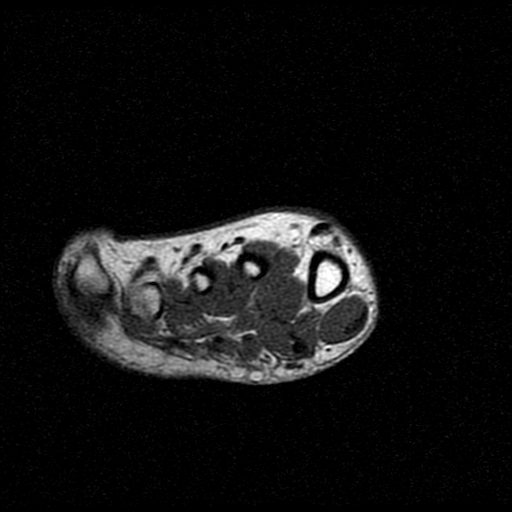
[im 21/32]
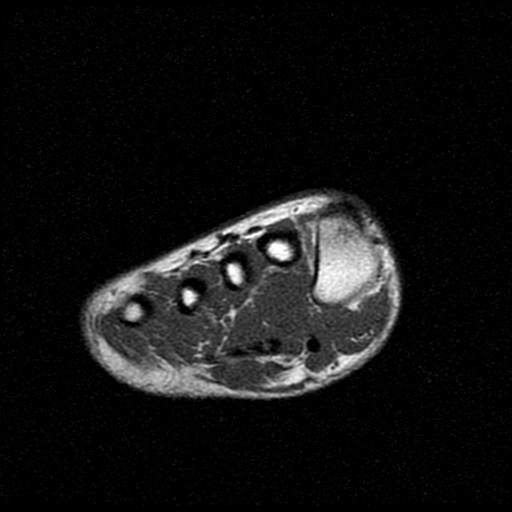
[im 26/32]
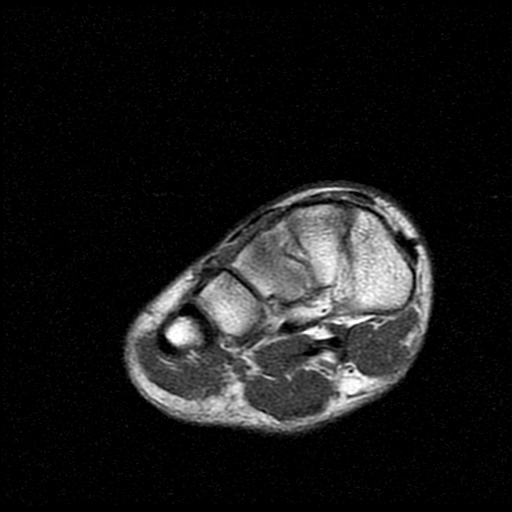
[im 32/32]
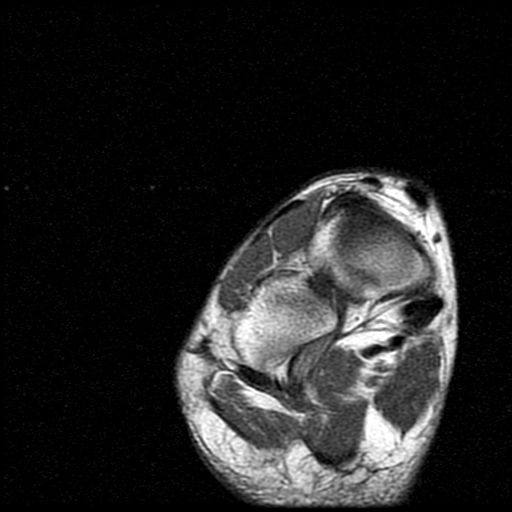

[Series 7: T1 · oblique · 3.0mm · 0.31mm/px · 5 of 24 slices shown (3 of 3)]
[im 1/24]
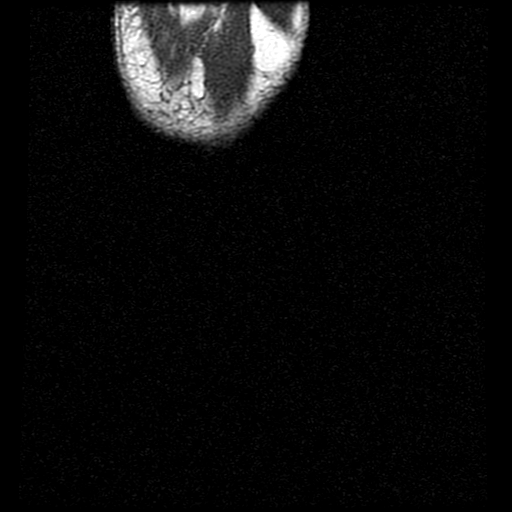
[im 6/24]
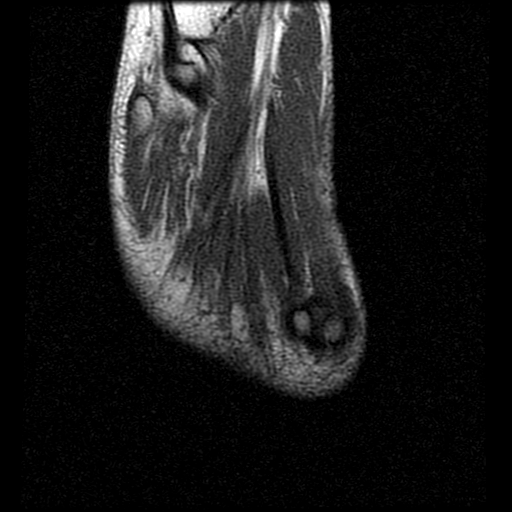
[im 12/24]
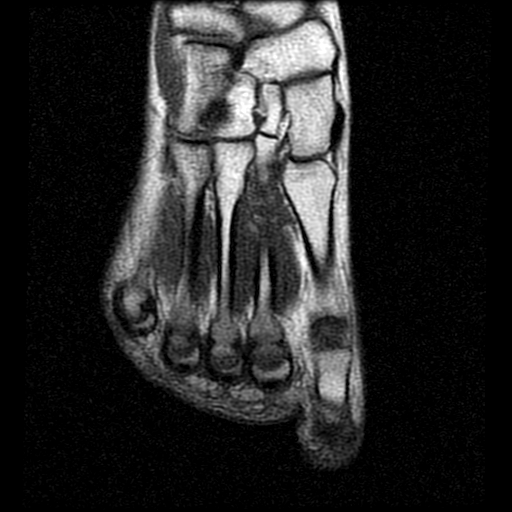
[im 18/24]
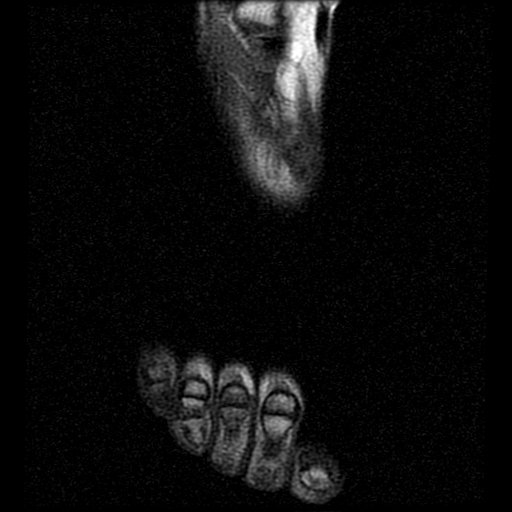
[im 24/24]
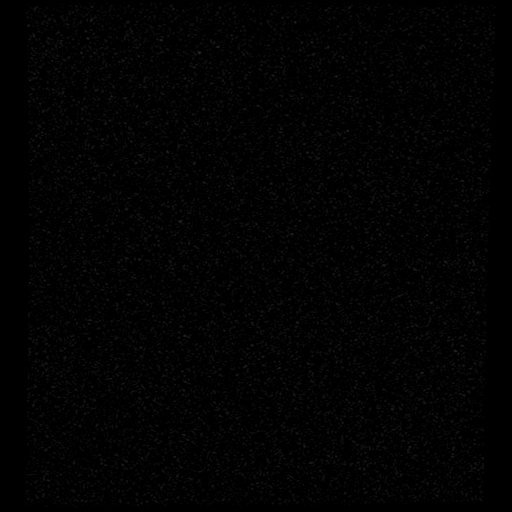

[24 of 40 positions shown; findings below may reference images not displayed]

FINDINGS: Standard imaging protocol was done for evaluation of the right forefoot. There
is normal tarsometatarsal alignment and the Lisfranc ligament is intact. There
are no findings of acute bone edema or bone injury. The collateral ligaments
appear intact. No acute reactive edema or inflammation is seen in the
surrounding soft tissues. The flexor and extensor tendons appear normal. No
abnormality is seen within the sesamoids.
IMPRESSION: 1. Normal alignment to the foot. There are no findings of acute bone edema or
acute bone injury.

2. The surrounding musculature appears normal. No soft tissue edema or
inflammation is evident.

## 2021-06-06 IMAGING — MR ANKRTWO
4 of 6 series · 19 of 40 positions shown · non-contrast
Comparison: none

Procedure(s): MR ankle RT wo con

MRI RIGHT ANKLE:
HISTORY: Twisting injury to foot and ankle with pain and swelling on and off for
2 years anterior pain and posterior ankle pain difficulty flexing no prior
surgery

[Series 3: PD · axial · 4.0mm · 0.25mm/px · z∈[-0,+113]mm · 8 of 27 slices shown]
[im 1/27]
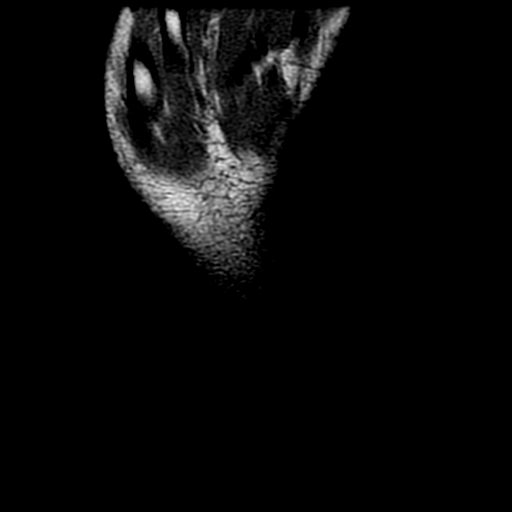
[im 4/27]
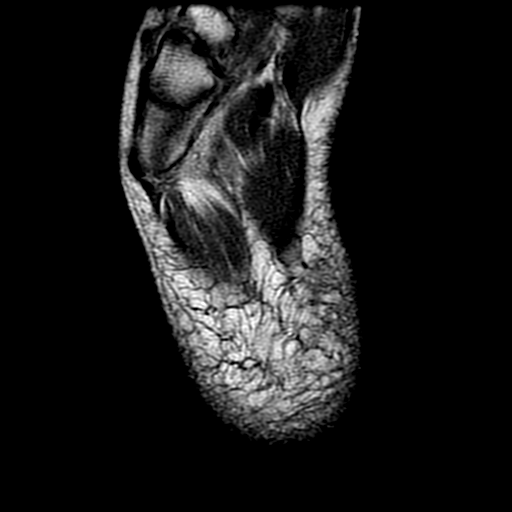
[im 8/27]
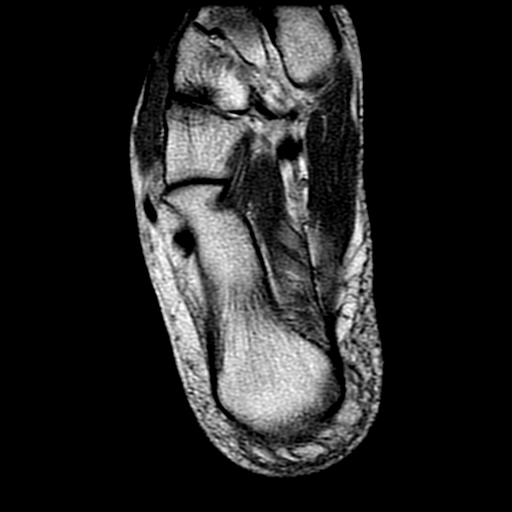
[im 12/27]
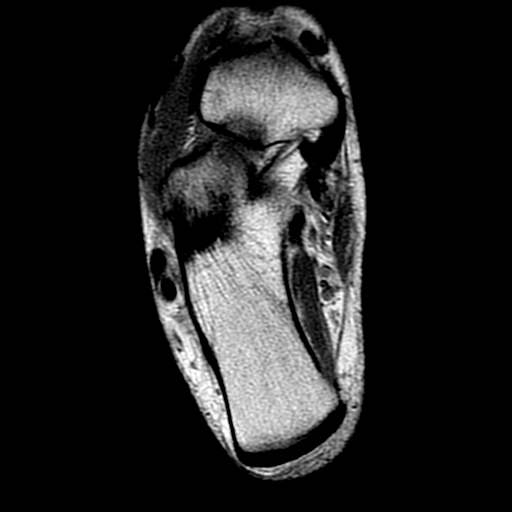
[im 15/27]
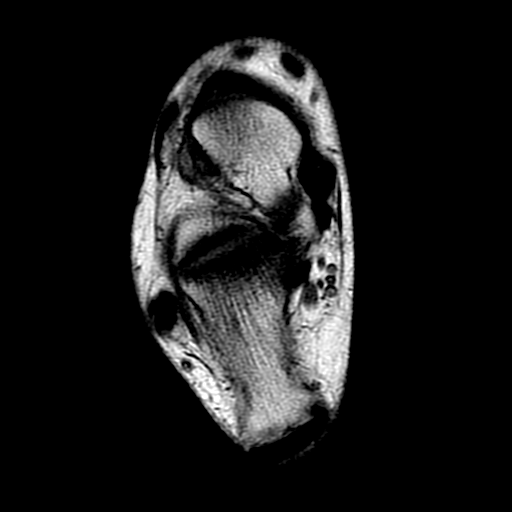
[im 19/27]
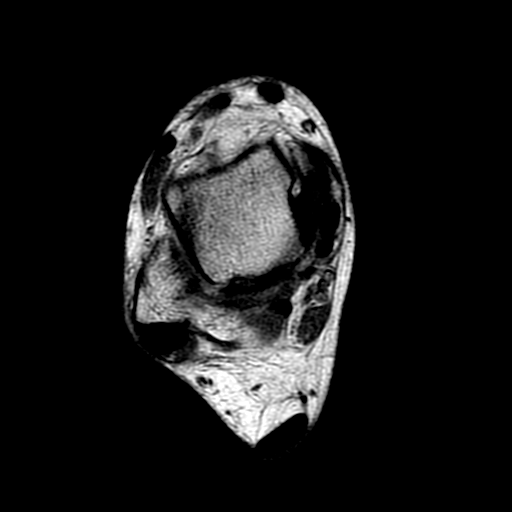
[im 23/27]
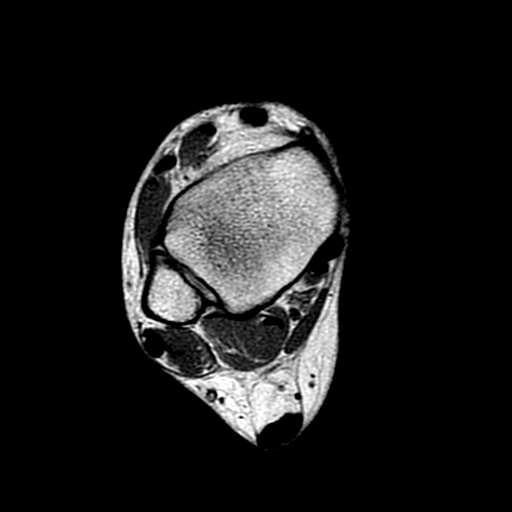
[im 27/27]
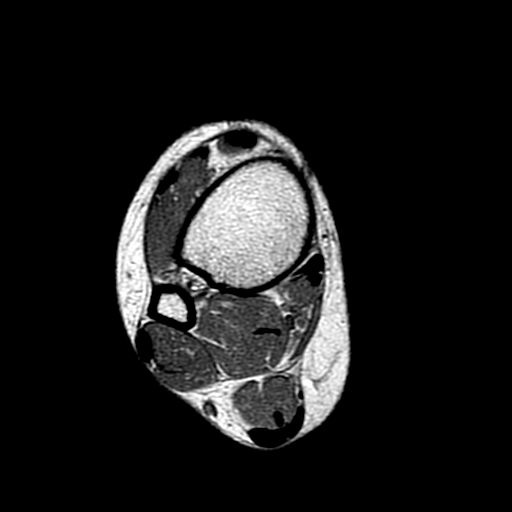

[Series 4: T2 fat-sat · axial · 4.0mm · 0.25mm/px · z∈[-0,+95]mm · 5 of 27 slices shown (1 of 3)]
[im 1/27]
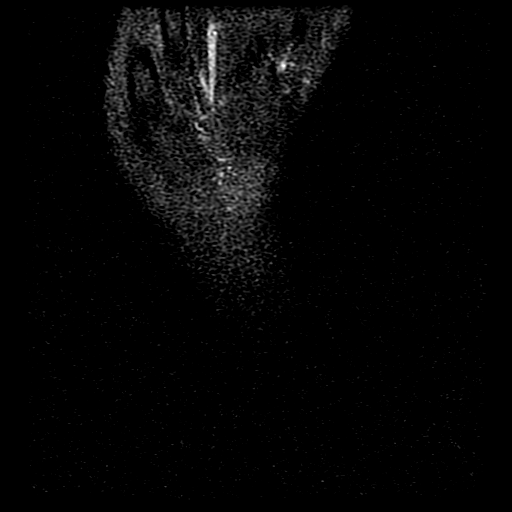
[im 4/27]
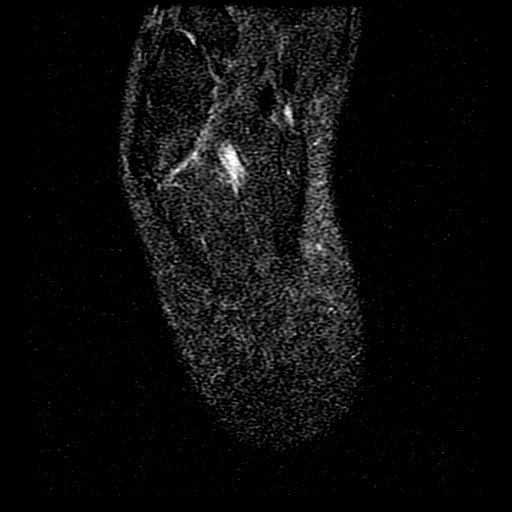
[im 8/27]
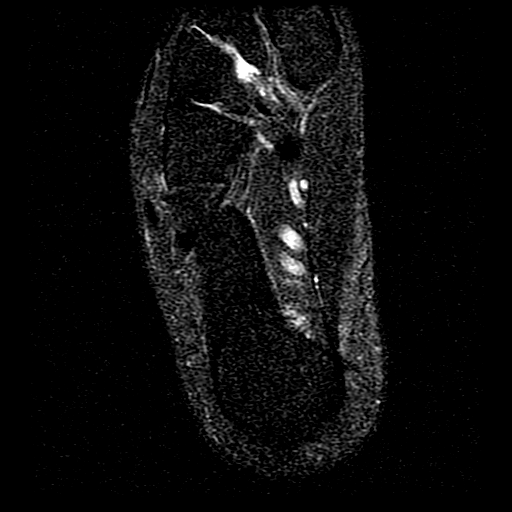
[im 15/27]
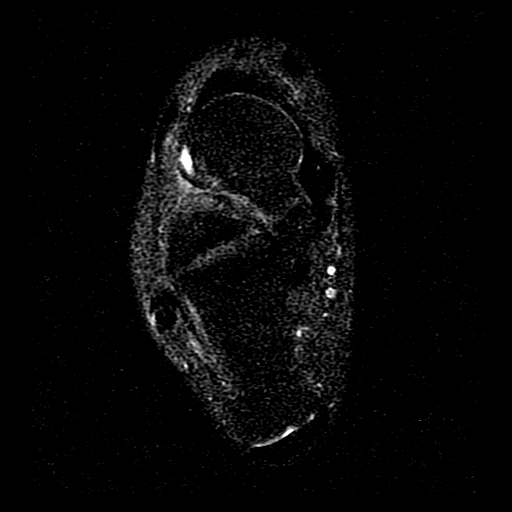
[im 23/27]
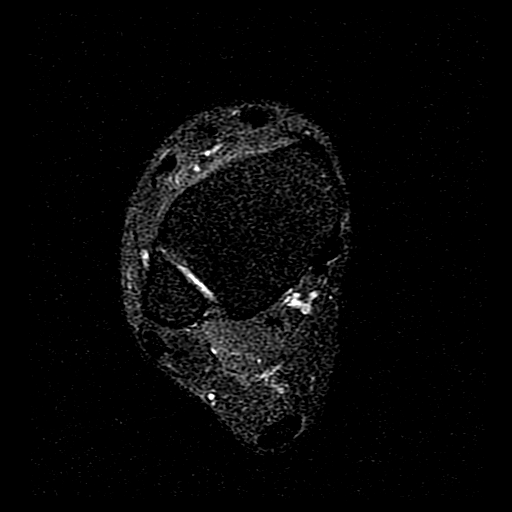

[Series 6: T2 fat-sat · sagittal · 3.0mm · 0.31mm/px · 3 of 19 slices shown (2 of 3)]
[im 1/19]
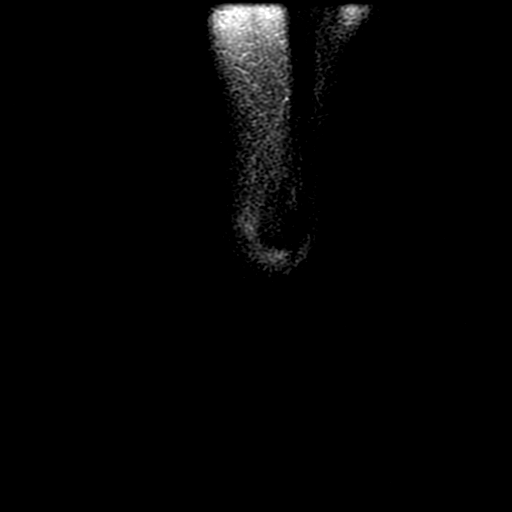
[im 10/19]
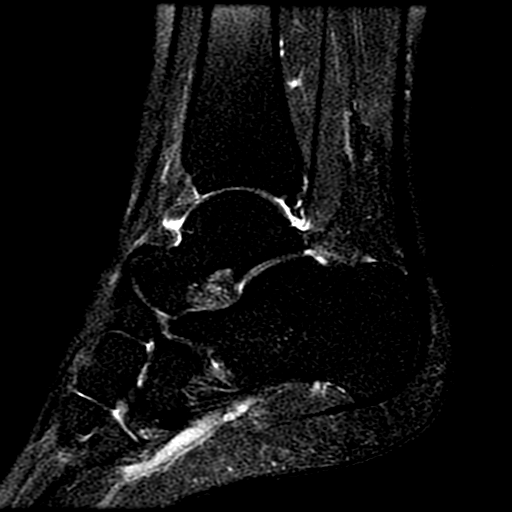
[im 19/19]
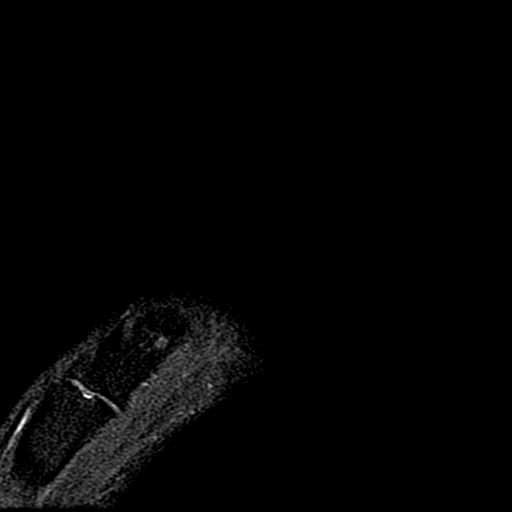

[Series 8: T2 fat-sat · coronal · 4.0mm · 0.31mm/px · 3 of 27 slices shown (3 of 3)]
[im 5/27]
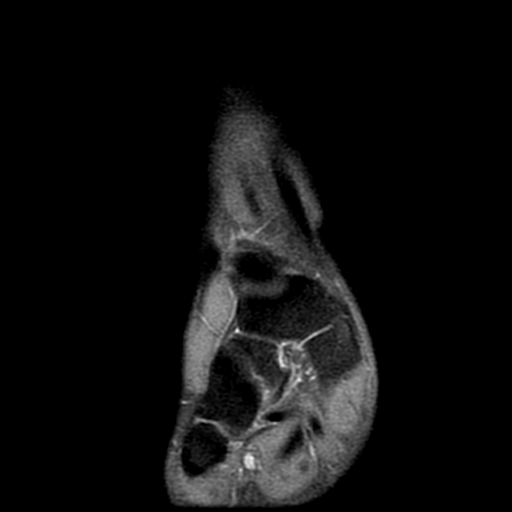
[im 14/27]
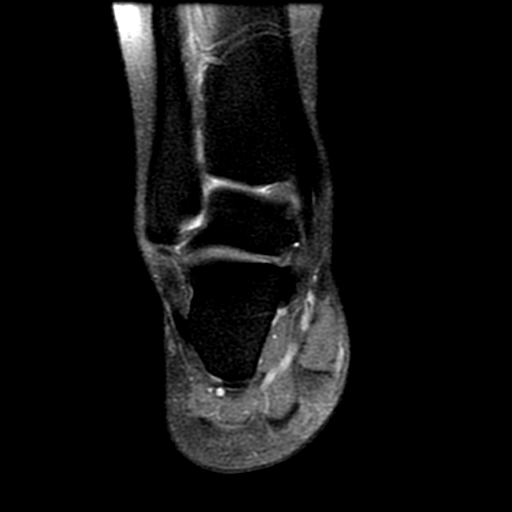
[im 22/27]
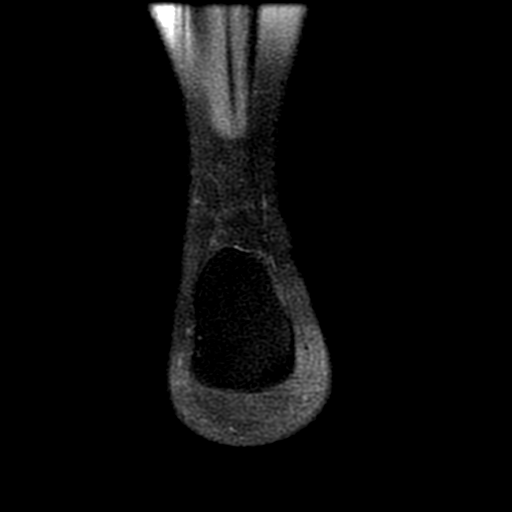

[19 of 40 positions shown; findings below may reference images not displayed]

FINDINGS: Standard imaging protocol is done for evaluation of the ankle.

Osseous And Cartilaginous Structures:
The bone marrow has normal signal intensity. There are no findings of bone
contusion or bone edema. The talar dome appears intact. There are no findings
of osteochondral abnormality. The distal tibia and fibula appear normal. No
abnormalities are seen within the calcaneus.

Ligaments And Tendons:
The visualized lower interosseous ligament appears intact. The anterior and
posterior talofibular and tibiofibular ligaments appear intact. The
calcaneofibular ligament appears normal. The superficial and deep deltoid
ligaments appear intact. The lateral peroneus tendons appear intact. Posteriorly
the Achilles tendon appears intact without findings of tendonitis or tear. The
medial and anterior tendons show normal course and signal without findings of
tenosynovitis.

Ankle Joint And Additional Findings:
No abnormalities are seen within the sinus tarsi or tarsal tunnel. Posteriorly
there are no findings of os trigonum syndrome. There is a small ankle joint
effusion likely physiologic. Plantar fascia appears normal. No soft tissue
edema is seen.
IMPRESSION: 1. No osseous abnormalities are seen, the talar dome appears intact.

2. The ligamentous and tendinous structures appear intact.

3. Small physiologic ankle joint effusion. No other abnormalities are seen to
account for the patient's ankle pain.
